# Patient Record
Sex: Female | Born: 1935
Health system: Southern US, Community
[De-identification: ages and names within clinical notes are randomized; demographics above are authoritative.]

## PROBLEM LIST (undated history)

## (undated) DIAGNOSIS — G43909 Migraine, unspecified, not intractable, without status migrainosus: Secondary | ICD-10-CM

## (undated) DIAGNOSIS — Z8669 Personal history of other diseases of the nervous system and sense organs: Secondary | ICD-10-CM

## (undated) DIAGNOSIS — M1710 Unilateral primary osteoarthritis, unspecified knee: Secondary | ICD-10-CM

## (undated) DIAGNOSIS — E079 Disorder of thyroid, unspecified: Secondary | ICD-10-CM

## (undated) DIAGNOSIS — I4891 Unspecified atrial fibrillation: Secondary | ICD-10-CM

## (undated) DIAGNOSIS — M810 Age-related osteoporosis without current pathological fracture: Secondary | ICD-10-CM

## (undated) HISTORY — DX: Migraine, unspecified, not intractable, without status migrainosus: G43.909

## (undated) HISTORY — DX: Unspecified atrial fibrillation: I48.91

## (undated) HISTORY — DX: Unilateral primary osteoarthritis, unspecified knee: M17.10

## (undated) HISTORY — PX: CATARACT EXTRACTION: SUR2

---
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
## 1989-08-19 HISTORY — PX: BUNIONECTOMY: SHX129

## 2003-08-10 ENCOUNTER — Other Ambulatory Visit: Admission: RE | Admit: 2003-08-10 | Discharge: 2003-08-10 | Payer: Self-pay | Admitting: Family Medicine

## 2005-08-30 ENCOUNTER — Other Ambulatory Visit: Admission: RE | Admit: 2005-08-30 | Discharge: 2005-08-30 | Payer: Self-pay | Admitting: Family Medicine

## 2011-02-13 ENCOUNTER — Ambulatory Visit
Admission: RE | Admit: 2011-02-13 | Discharge: 2011-02-13 | Disposition: A | Discharge status: Home / Self Care | Payer: Medicare PPO | Source: Ambulatory Visit | Attending: Cardiology | Admitting: Cardiology

## 2011-02-13 ENCOUNTER — Other Ambulatory Visit: Payer: Self-pay | Admitting: Cardiology

## 2011-02-13 DIAGNOSIS — R0789 Other chest pain: Secondary | ICD-10-CM

## 2017-10-27 ENCOUNTER — Encounter: Payer: Self-pay | Admitting: Family Medicine

## 2017-10-27 ENCOUNTER — Ambulatory Visit: Payer: Medicare Other | Admitting: Family Medicine

## 2017-10-27 ENCOUNTER — Other Ambulatory Visit: Payer: Self-pay

## 2017-10-27 VITALS — BP 132/84 | HR 90 | Temp 98.5°F | Ht 66.0 in | Wt 168.4 lb

## 2017-10-27 DIAGNOSIS — E039 Hypothyroidism, unspecified: Secondary | ICD-10-CM | POA: Diagnosis not present

## 2017-10-27 DIAGNOSIS — Z8669 Personal history of other diseases of the nervous system and sense organs: Secondary | ICD-10-CM

## 2017-10-27 DIAGNOSIS — G43009 Migraine without aura, not intractable, without status migrainosus: Secondary | ICD-10-CM

## 2017-10-27 DIAGNOSIS — M1711 Unilateral primary osteoarthritis, right knee: Secondary | ICD-10-CM

## 2017-10-27 DIAGNOSIS — F5101 Primary insomnia: Secondary | ICD-10-CM | POA: Diagnosis not present

## 2017-10-27 DIAGNOSIS — R42 Dizziness and giddiness: Secondary | ICD-10-CM

## 2017-10-27 DIAGNOSIS — M1710 Unilateral primary osteoarthritis, unspecified knee: Secondary | ICD-10-CM | POA: Insufficient documentation

## 2017-10-27 HISTORY — DX: Personal history of other diseases of the nervous system and sense organs: Z86.69

## 2017-10-27 LAB — COMPREHENSIVE METABOLIC PANEL
ALT: 14 U/L (ref 0–35)
AST: 17 U/L (ref 0–37)
Albumin: 4.4 g/dL (ref 3.5–5.2)
Alkaline Phosphatase: 66 U/L (ref 39–117)
BUN: 19 mg/dL (ref 6–23)
CHLORIDE: 102 meq/L (ref 96–112)
CO2: 31 meq/L (ref 19–32)
Calcium: 10 mg/dL (ref 8.4–10.5)
Creatinine, Ser: 1.04 mg/dL (ref 0.40–1.20)
GFR: 53.61 mL/min — ABNORMAL LOW (ref >60.00)
GLUCOSE: 96 mg/dL (ref 70–99)
POTASSIUM: 4.6 meq/L (ref 3.5–5.1)
SODIUM: 140 meq/L (ref 135–145)
Total Bilirubin: 0.6 mg/dL (ref 0.2–1.2)
Total Protein: 7.1 g/dL (ref 6.0–8.3)

## 2017-10-27 LAB — CBC WITH DIFFERENTIAL/PLATELET
BASOS ABS: 0.1 10*3/uL (ref 0.0–0.1)
Basophils Relative: 0.9 % (ref 0.0–3.0)
EOS PCT: 3.5 % (ref 0.0–5.0)
Eosinophils Absolute: 0.3 10*3/uL (ref 0.0–0.7)
HCT: 45.1 % (ref 36.0–46.0)
Hemoglobin: 14.9 g/dL (ref 12.0–15.0)
LYMPHS ABS: 2.9 10*3/uL (ref 0.7–4.0)
LYMPHS PCT: 35.4 % (ref 12.0–46.0)
MCHC: 32.9 g/dL (ref 30.0–36.0)
MCV: 91.4 fl (ref 78.0–100.0)
MONOS PCT: 7.1 % (ref 3.0–12.0)
Monocytes Absolute: 0.6 10*3/uL (ref 0.1–1.0)
NEUTROS PCT: 53.1 % (ref 43.0–77.0)
Neutro Abs: 4.3 10*3/uL (ref 1.4–7.7)
Platelets: 261 10*3/uL (ref 150.0–400.0)
RBC: 4.93 Mil/uL (ref 3.87–5.11)
RDW: 14.7 % (ref 11.5–15.5)
WBC: 8.1 10*3/uL (ref 4.0–10.5)

## 2017-10-27 LAB — LIPID PANEL
CHOL/HDL RATIO: 3
CHOLESTEROL: 191 mg/dL (ref 0–200)
HDL: 75.6 mg/dL (ref >39.00)
LDL CALC: 99 mg/dL (ref 0–99)
NONHDL: 115.42
Triglycerides: 83 mg/dL (ref 0.0–149.0)
VLDL: 16.6 mg/dL (ref 0.0–40.0)

## 2017-10-27 MED ORDER — SUVOREXANT 15 MG PO TABS: 15.0000 mg | ORAL_TABLET | Freq: Every day | ORAL | 5 refills | Status: DC

## 2017-10-27 MED ORDER — LORAZEPAM 1 MG PO TABS: ORAL_TABLET | ORAL | 0 refills | Status: DC

## 2017-10-27 NOTE — Patient Instructions (Signed)
Please return in 6 months for recheck.   Please wean off your ativan as instructed on the label, then start the belsomra 15 x 2 weeks; let me know if that is not helping and we can then try the 20mg  dose.   Use Tylenol ES 500 twice a day as needed for knee pain. You may also use Glucosamine twice a day - over the counter.   I will let you know your lab results.   Keep well hydrated.   It was a pleasure meeting you today! Thank you for choosing us to meet your healthcare needs! I truly look forward to working with you. If you have any questions or concerns, please send me a message via Mychart or call the office at [phone].

## 2017-10-27 NOTE — Progress Notes (Signed)
Subjective  CC:  Chief Complaint  Patient presents with  . Establish Care    [place]   . Knee Pain    Right sided knee pain    HPI: Cathy Lambert is a 84 y.o. female who presents to [facility] today to establish care with me as a new patient.  She has the following concerns or needs:   Very pleasant healthy 84 yo who now is living in a retirement community [place]. Her daughter teaches at [employer]. Has another daughter in [place], and a child in [place] where she spent the last 2 years. Prior to that she lived in [place]. She has a large support/friend system here.   Lives independently.   Feels great overall. Has right knee pain, worse with lowering herself when sitting down. No redness or warmth but does get some swelling. Doesn't take anything for it. Uses a knee sleeve at times. No locking or giveway. Has never had evaluation for it in past.   Lightheadedness: at times; bp has always been excellent to low; has taken bp with sxs: 90s/60s (down from 120s/70s her norm). No vertigo sxs. No orthostatic sxs. Lasts minutes to 30 minutes. No cp or palpitations. Not on heart or BP meds. Never has fallen or passed out.   Has vague light sensation over ruq abdominal skin x 2 weeks w/o rash or pain.   Hypothyroidsm: on replacement and due for recheck. Energy levels are great. No sxs of high or low thyroid.   Ativan use since 1995 for sleep. Uses intermittently now; has trouble falling asleep due to "overthinking and planning for her upcoming days"   We updated and reviewed the patient's past history in detail and it is documented below.  Patient Active Problem List   Diagnosis Date Noted  . Acquired hypothyroidism 10/27/2017  . Migraine headache without aura 10/27/2017  . Primary osteoarthritis of knee    Health Maintenance  Topic Date Due  . MAMMOGRAM  03/07/2018  . TETANUS/TDAP  06/16/2022  . INFLUENZA VACCINE  Completed  .  DEXA SCAN  Completed  . PNA vac Low Risk Adult  Completed   Immunization History  Administered Date(s) Administered  . Influenza, High Dose Seasonal PF 06/07/2017  . Zoster 11/06/2007   Current Meds  Medication Sig  . cholecalciferol (VITAMIN D) 1000 units tablet Take 2,000 Units by mouth daily.  [name] Kitchen. levothyroxine (SYNTHROID, LEVOTHROID) 75 MCG tablet Take 75 mcg by mouth daily before breakfast.  . LORazepam (ATIVAN) 1 MG tablet Take 1 tablet (1 mg total) by mouth 3 (three) times a week for 7 days, THEN 0.5 tablets (0.5 mg total) 3 (three) times a week for 7 days.  . magnesium gluconate (MAGONATE) 500 MG tablet Take 500 mg by mouth 2 (two) times daily.  . Multiple Vitamin (MULTIVITAMIN) tablet Take 1 tablet by mouth daily.  . [DISCONTINUED] LORazepam (ATIVAN) 2 MG tablet Take 2 mg by mouth every 6 (six) hours as needed for anxiety.    Allergies: Patient has No Known Allergies. Past Medical History Patient  has a past medical history of Migraines and Primary osteoarthritis of knee. Past Surgical History Patient  has a past surgical history that includes Bunionectomy (1991); Cataract extraction (2008-2009); and Tonsillectomy and adenoidectomy (1953). Family History: Patient family history includes Arthritis in her mother; Cancer in her maternal grandmother, mother, paternal grandfather, sister, and son; Diabetes in her brother; Hearing loss in her sister; Stroke  in her maternal grandfather. Social History:  Patient  reports that  has never smoked. she has never used smokeless tobacco. She reports that she does not drink alcohol or use drugs.  Review of Systems: Constitutional: negative for fever or malaise Ophthalmic: negative for photophobia, double vision or loss of vision Cardiovascular: negative for chest pain, dyspnea on exertion, or new LE swelling Respiratory: negative for SOB or persistent cough Gastrointestinal: negative for abdominal pain, change in bowel habits or  melena Genitourinary: negative for dysuria or gross hematuria Musculoskeletal: negative for new gait disturbance or muscular weakness Integumentary: negative for new or persistent rashes Neurological: negative for TIA or stroke symptoms Psychiatric: negative for SI or delusions Allergic/Immunologic: negative for hives  Patient Care Team    Relationship Specialty Notifications Start End  Willow Ora, MD PCP - General Family Medicine  10/27/17     Objective  Vitals: BP 132/84   Pulse 90   Temp 98.5 F (36.9 C)   Ht 5\' 6"  (1.676 m)   Wt 168 lb 6.4 oz (76.4 kg)   BMI 27.18 kg/m  General:  Well developed, well nourished, no acute distress, looks younger than stated age  Psych:  Alert and oriented,normal mood and affect HEENT:  Normocephalic, atraumatic, non-icteric sclera, PERRL, oropharynx is without mass or exudate, supple neck without adenopathy, mass or thyromegaly Cardiovascular:  RRR without gallop, rub or murmur, nondisplaced PMI Respiratory:  Good breath sounds bilaterally, CTAB with normal respiratory effort Gastrointestinal: normal bowel sounds, soft, non-tender, no noted masses. No HSM MSK: no deformities, contusions. Joints are without erythema or swelling Skin:  Warm, no rashes or suspicious lesions noted Neurologic:    Mental status is normal. Gross motor and sensory exams are normal. Normal gait  Assessment  1. Acquired hypothyroidism   2. Migraine without aura and without status migrainosus, not intractable   3. Primary insomnia   4. Episodic lightheadedness   5. Primary osteoarthritis of right knee      Plan   Recheck thyroid on meds. Clinically euthyroid  Rare and remote migraine history.   Insomnia: wean ativan and trial of belsomra. Extensive education given.   Lightheadedness: keep hydrated. No red flag sxs. Monitor and check blood work.   OA - discussed supportive care; further eval if worsens.   Follow up:  Return in about 3 months (around  01/27/2018) for recheck sleep. needs AWV as well.   Commons side effects, risks, benefits, and alternatives for medications and treatment plan prescribed today were discussed, and the patient expressed understanding of the given instructions. Patient is instructed to call or message via MyChart if he/she has any questions or concerns regarding our treatment plan. No barriers to understanding were identified. We discussed Red Flag symptoms and signs in detail. Patient expressed understanding regarding what to do in case of urgent or emergency type symptoms.   Medication list was reconciled, printed and provided to the patient in AVS. Patient instructions and summary information was reviewed with the patient as documented in the AVS. This note was prepared with assistance of Dragon voice recognition software. Occasional wrong-word or sound-a-like substitutions may have occurred due to the inherent limitations of voice recognition software  Orders Placed This Encounter  Procedures  . CBC with Differential/Platelet  . Comprehensive metabolic panel  . Lipid panel  . TSH   Meds ordered this encounter  Medications  . LORazepam (ATIVAN) 1 MG tablet    Sig: Take 1 tablet (1 mg total) by mouth 3 (three)  times a week for 7 days, THEN 0.5 tablets (0.5 mg total) 3 (three) times a week for 7 days.    Dispense:  10 tablet    Refill:  0  . Suvorexant (BELSOMRA) 15 MG TABS    Sig: Take 15 mg by mouth at bedtime.    Dispense:  30 tablet    Refill:  5

## 2017-10-28 ENCOUNTER — Other Ambulatory Visit: Payer: Self-pay | Admitting: Family Medicine

## 2017-10-28 LAB — TSH: TSH: 0.18 u[IU]/mL — AB (ref 0.35–4.50)

## 2017-10-28 MED ORDER — LEVOTHYROXINE SODIUM 50 MCG PO TABS: 50.0000 ug | ORAL_TABLET | Freq: Every day | ORAL | 1 refills | Status: DC

## 2017-10-28 NOTE — Progress Notes (Signed)
Lab Results  Component Value Date   TSH 0.18 (L) 10/27/2017   Decrease synthroid to 50 from 75; recheck 8 weeks.

## 2017-10-30 ENCOUNTER — Telehealth: Payer: Self-pay | Admitting: Emergency Medicine

## 2017-10-30 ENCOUNTER — Other Ambulatory Visit: Payer: Self-pay

## 2017-10-30 MED ORDER — LORAZEPAM 2 MG PO TABS: 1.0000 mg | ORAL_TABLET | Freq: Every evening | ORAL | 3 refills | Status: DC | PRN

## 2017-10-30 NOTE — Telephone Encounter (Signed)
It should be free with the free coupon?

## 2017-10-30 NOTE — Telephone Encounter (Signed)
Please send Lorazepam 2mg , 30 tabs, 1/2 tablet daily for the patient to [facility] [address]. Thank you!

## 2017-10-30 NOTE — Telephone Encounter (Signed)
Called the patient and she stated that since she is on Medicare she will not be able to use the coupon. She stated that the prescription will be $365. Spoke with Dr. Mardelle MatteAndy and she stated that she was taking the Lorazepam and this is okay for her to continue to take. Patient stated that she wants the Lorazepam 2mg  and to take half daily, 30 pills and she uses for 90 days. [facility].

## 2017-10-30 NOTE — Telephone Encounter (Signed)
Prior Authroization sent via Cover my Meds. Awaiting Approval

## 2017-10-30 NOTE — Telephone Encounter (Signed)
Patient is requesting another Prescription, Belsomra is too expensive. Please advise.

## 2017-10-31 NOTE — Telephone Encounter (Signed)
Please start appeal. She has been on this medication for > 20 years.

## 2017-10-31 NOTE — Telephone Encounter (Signed)
Medicare denied the prescription for Lorazepam. Please advise on another medication.    Kathi SimpersAmy Peterman,  LPN

## 2017-10-31 NOTE — Telephone Encounter (Signed)
[facility]- [phone]

## 2017-11-04 ENCOUNTER — Telehealth: Payer: Self-pay | Admitting: Emergency Medicine

## 2017-11-04 NOTE — Telephone Encounter (Signed)
Spoke with [employer] regarding appeal. They are working on appeal .

## 2017-11-04 NOTE — Telephone Encounter (Signed)
Copied from CRM [phone]. Topic: Inquiry >> Nov 03, 2017  6:09 PM Alexander BergeronBarksdale, Harvey B wrote: Reason for CRM: [employer] appeals and grievance dept called and asked to be called back to discuss a matter concerning the pt, contact [phone]  >> Nov 04, 2017  9:41 AM Maia Pettiesrtiz, Kristie S wrote: Caller Name: Cordelia PenSherry at [facility] [place] Phone: [phone]  Calling about appeal for lorazepam. Requesting to speak to Naeemah Jasmer or Dr. Mardelle MatteAndy. Please call back when available.

## 2017-11-06 NOTE — Telephone Encounter (Signed)
Insurance approved, Quarry managerauth # [id number]

## 2017-11-06 NOTE — Telephone Encounter (Signed)
Dr Mardelle MatteAndy,   Lorain ChildesFYI, Patient's Lorazepam was approved.    Kathi SimpersAmy Eutha Cude,  LPN

## 2017-12-22 ENCOUNTER — Other Ambulatory Visit: Payer: Self-pay

## 2017-12-22 ENCOUNTER — Ambulatory Visit: Payer: Medicare Other | Admitting: Family Medicine

## 2017-12-22 ENCOUNTER — Encounter: Payer: Self-pay | Admitting: Family Medicine

## 2017-12-22 VITALS — BP 138/86 | HR 81 | Temp 98.1°F | Ht 66.0 in | Wt 170.2 lb

## 2017-12-22 DIAGNOSIS — E039 Hypothyroidism, unspecified: Secondary | ICD-10-CM | POA: Diagnosis not present

## 2017-12-22 DIAGNOSIS — M1711 Unilateral primary osteoarthritis, right knee: Secondary | ICD-10-CM

## 2017-12-22 LAB — TSH: TSH: 7.47 u[IU]/mL — ABNORMAL HIGH (ref 0.35–4.50)

## 2017-12-22 MED ORDER — DICLOFENAC SODIUM 1 % TD GEL: 4.0000 g | Freq: Four times a day (QID) | TRANSDERMAL | 3 refills | Status: DC | PRN

## 2017-12-22 NOTE — Patient Instructions (Addendum)
Please schedule a follow up appointment with me in 6 months for your Thyroid.   Medicare recommends an Annual Wellness Visit for all patients. Please schedule this to be done with our Nurse Educator, Maudie Mercury. This is an informative "talk" visit; it's goals are to ensure that your health care needs are being met and to give you education regarding avoiding falls, ensuring you are not suffering from depression or problems with memory or thinking, and to educate you on Advance Care Planning. It helps me take good care of you!    Osteoarthritis Osteoarthritis is a type of arthritis that affects tissue that covers the ends of bones in joints (cartilage). Cartilage acts as a cushion between the bones and helps them move smoothly. Osteoarthritis results when cartilage in the joints gets worn down. Osteoarthritis is sometimes called "wear and tear" arthritis. Osteoarthritis is the most common form of arthritis. It often occurs in older people. It is a condition that gets worse over time (a progressive condition). Joints that are most often affected by this condition are in:  Fingers.  Toes.  Hips.  Knees.  Spine, including neck and lower back.  What are the causes? This condition is caused by age-related wearing down of cartilage that covers the ends of bones. What increases the risk? The following factors may make you more likely to develop this condition:  Older age.  Being overweight or obese.  Overuse of joints, such as in athletes.  Past injury of a joint.  Past surgery on a joint.  Family history of osteoarthritis.  What are the signs or symptoms? The main symptoms of this condition are pain, swelling, and stiffness in the joint. The joint may lose its shape over time. Small pieces of bone or cartilage may break off and float inside of the joint, which may cause more pain and damage to the joint. Small deposits of bone (osteophytes) may grow on the edges of the joint. Other symptoms  may include:  A grating or scraping feeling inside the joint when you move it.  Popping or creaking sounds when you move.  Symptoms may affect one or more joints. Osteoarthritis in a major joint, such as your knee or hip, can make it painful to walk or exercise. If you have osteoarthritis in your hands, you might not be able to grip items, twist your hand, or control small movements of your hands and fingers (fine motor skills). How is this diagnosed? This condition may be diagnosed based on:  Your medical history.  A physical exam.  Your symptoms.  X-rays of the affected joint(s).  Blood tests to rule out other types of arthritis.  How is this treated? There is no cure for this condition, but treatment can help to control pain and improve joint function. Treatment plans may include:  A prescribed exercise program that allows for rest and joint relief. You may work with a physical therapist.  A weight control plan.  Pain relief techniques, such as: ? Applying heat and cold to the joint. ? Electric pulses delivered to nerve endings under the skin (transcutaneous electrical nerve stimulation, or TENS). ? Massage. ? Certain nutritional supplements.  NSAIDs or prescription medicines to help relieve pain.  Medicine to help relieve pain and inflammation (corticosteroids). This can be given by mouth (orally) or as an injection.  Assistive devices, such as a brace, wrap, splint, specialized glove, or cane.  Surgery, such as: ? An osteotomy. This is done to reposition the bones and relieve  pain or to remove loose pieces of bone and cartilage. ? Joint replacement surgery. You may need this surgery if you have very bad (advanced) osteoarthritis.  Follow these instructions at home: Activity  Rest your affected joints as directed by your health care provider.  Do not drive or use heavy machinery while taking prescription pain medicine.  Exercise as directed. Your health care  provider or physical therapist may recommend specific types of exercise, such as: ? Strengthening exercises. These are done to strengthen the muscles that support joints that are affected by arthritis. They can be performed with weights or with exercise bands to add resistance. ? Aerobic activities. These are exercises, such as brisk walking or water aerobics, that get your heart pumping. ? Range-of-motion activities. These keep your joints easy to move. ? Balance and agility exercises. Managing pain, stiffness, and swelling  If directed, apply heat to the affected area as often as told by your health care provider. Use the heat source that your health care provider recommends, such as a moist heat pack or a heating pad. ? If you have a removable assistive device, remove it as told by your health care provider. ? Place a towel between your skin and the heat source. If your health care provider tells you to keep the assistive device on while you apply heat, place a towel between the assistive device and the heat source. ? Leave the heat on for 20-30 minutes. ? Remove the heat if your skin turns bright red. This is especially important if you are unable to feel pain, heat, or cold. You may have a greater risk of getting burned.  If directed, put ice on the affected joint: ? If you have a removable assistive device, remove it as told by your health care provider. ? Put ice in a plastic bag. ? Place a towel between your skin and the bag. If your health care provider tells you to keep the assistive device on during icing, place a towel between the assistive device and the bag. ? Leave the ice on for 20 minutes, 2-3 times a day. General instructions  Take over-the-counter and prescription medicines only as told by your health care provider.  Maintain a healthy weight. Follow instructions from your health care provider for weight control. These may include dietary restrictions.  Do not use any  products that contain nicotine or tobacco, such as cigarettes and e-cigarettes. These can delay bone healing. If you need help quitting, ask your health care provider.  Use assistive devices as directed by your health care provider.  Keep all follow-up visits as told by your health care provider. This is important. Where to find more information:  [employer] of Arthritis and Musculoskeletal and Skin Diseases: www.niams.SouthExposed.es  [employer] on Aging: http://kim-miller.com/  American College of Rheumatology: www.rheumatology.org Contact a health care provider if:  Your skin turns red.  You develop a rash.  You have pain that gets worse.  You have a fever along with joint or muscle aches. Get help right away if:  You lose a lot of weight.  You suddenly lose your appetite.  You have night sweats. Summary  Osteoarthritis is a type of arthritis that affects tissue covering the ends of bones in joints (cartilage).  This condition is caused by age-related wearing down of cartilage that covers the ends of bones.  The main symptom of this condition is pain, swelling, and stiffness in the joint.  There is no cure for this condition,  but treatment can help to control pain and improve joint function. This information is not intended to replace advice given to you by your health care provider. Make sure you discuss any questions you have with your health care provider. Document Released: 08/05/2005 Document Revised: 04/08/2016 Document Reviewed: 04/08/2016 Elsevier Interactive Patient Education  [employer].

## 2017-12-22 NOTE — Progress Notes (Signed)
Subjective  CC:  Chief Complaint  Patient presents with  . Hypothyroidism    Follow-Up on TSH- Recheck from 8 weeks ago     HPI: Cathy Lambert is a 84 y.o. female who presents to the office today to address the problems listed above in the chief complaint.  Last tsh was low. Decreased dose from 75 to 50 daily. Pt still feeling great. No AEs or concerns. No edema or sluggishness. No sob. Compliance is excellent.  C/o persistent right knee pain. Has OA suspected. hasnt' been using any meds. Pains with getting up from seated position. No swelling, locking or give way. No injury.   I reviewed the patients updated PMH, FH, and SocHx.    Patient Active Problem List   Diagnosis Date Noted  . Acquired hypothyroidism 10/27/2017  . Migraine headache without aura 10/27/2017  . Primary osteoarthritis of knee    Current Meds  Medication Sig  . cholecalciferol (VITAMIN D) 1000 units tablet Take 2,000 Units by mouth daily.  [name] Kitchen levothyroxine (SYNTHROID, LEVOTHROID) 50 MCG tablet Take 1 tablet (50 mcg total) by mouth daily before breakfast.  . LORazepam (ATIVAN) 2 MG tablet Take 0.5 tablets (1 mg total) by mouth at bedtime as needed for sleep.  . magnesium gluconate (MAGONATE) 500 MG tablet Take 500 mg by mouth 2 (two) times daily.  . Multiple Vitamin (MULTIVITAMIN) tablet Take 1 tablet by mouth daily.    Allergies: Patient has No Known Allergies. Family History: Patient family history includes Arthritis in her mother; Cancer in her maternal grandmother, mother, paternal grandfather, sister, and son; Diabetes in her brother; Hearing loss in her sister; Stroke in her maternal grandfather. Social History:  Patient  reports that she has never smoked. She has never used smokeless tobacco. She reports that she does not drink alcohol or use drugs.  Review of Systems: Constitutional: Negative for fever malaise or anorexia Cardiovascular: negative for chest pain Respiratory: negative for SOB or  persistent cough Gastrointestinal: negative for abdominal pain  Objective  Vitals: BP 138/86   Pulse 81   Temp 98.1 F (36.7 C)   Ht  (1.676 m)   Wt 170 lb 3.2 oz (77.2 kg)   BMI 27.47 kg/m  General: no acute distress , A&Ox3 HEENT: PEERL, conjunctiva normal, Oropharynx moist,neck is supple, no goiter or thyroid nodules Cardiovascular:  RRR without murmur or gallop.  Respiratory:  Good breath sounds bilaterally, CTAB with normal respiratory effort Ext: no edema Neuro: no tremor Skin:  Warm, no rashes  Assessment  1. Acquired hypothyroidism   2. Primary osteoarthritis of right knee      Plan   Recheck TSH:  Adjust meds as needed. Gave instructions: Recommendations for proper thyroid replacement: - in am - fasting - at least 30 min from b'fast - no PPIs, Ca, Fe, MVI within 4 hours   OA knee: start tylenol daily and/or voltaren gel daily. F/u if not improving.   Follow up: Return in about 6 months (around 06/24/2018) for recheck, AWV at patient's convenience.    Commons side effects, risks, benefits, and alternatives for medications and treatment plan prescribed today were discussed, and the patient expressed understanding of the given instructions. Patient is instructed to call or message via MyChart if he/she has any questions or concerns regarding our treatment plan. No barriers to understanding were identified. We discussed Red Flag symptoms and signs in detail. Patient expressed understanding regarding what to do in case of urgent or emergency type symptoms.  Medication list was reconciled, printed and provided to the patient in AVS. Patient instructions and summary information was reviewed with the patient as documented in the AVS. This note was prepared with assistance of Dragon voice recognition software. Occasional wrong-word or sound-a-like substitutions may have occurred due to the inherent limitations of voice recognition software  Orders Placed This  Encounter  Procedures  . TSH   Meds ordered this encounter  Medications  . diclofenac sodium (VOLTAREN) 1 % GEL    Sig: Apply 4 g topically 4 (four) times daily as needed.    Dispense:  400 g    Refill:  3

## 2017-12-23 ENCOUNTER — Telehealth: Payer: Self-pay | Admitting: Family Medicine

## 2017-12-23 NOTE — Telephone Encounter (Signed)
Copied from CRM [id number]. Topic: Quick Communication - See Telephone Encounter >> Dec 23, 2017  1:06 PM Waymon Amato wrote: Insurance co is calling to let office know that the medication diclofenac has been authorized and approved on 12/22/17 and good for a year of  12/23/2018 reference cover my meds

## 2017-12-24 ENCOUNTER — Other Ambulatory Visit: Payer: Self-pay | Admitting: Emergency Medicine

## 2017-12-24 DIAGNOSIS — E039 Hypothyroidism, unspecified: Secondary | ICD-10-CM

## 2017-12-24 MED ORDER — LEVOTHYROXINE SODIUM 50 MCG PO TABS: ORAL_TABLET | ORAL | 1 refills | Status: DC

## 2017-12-24 NOTE — Telephone Encounter (Signed)
Noted  

## 2017-12-24 NOTE — Telephone Encounter (Signed)
See attached note from insurance co. Regarding the diclofenac

## 2017-12-24 NOTE — Addendum Note (Signed)
Addended by: Asencion Partridge on: 12/24/2017 08:20 AM   Modules accepted: Orders

## 2018-01-26 ENCOUNTER — Ambulatory Visit: Payer: Medicare Other | Admitting: Family Medicine

## 2018-02-26 ENCOUNTER — Other Ambulatory Visit (INDEPENDENT_AMBULATORY_CARE_PROVIDER_SITE_OTHER): Payer: Medicare Other

## 2018-02-26 DIAGNOSIS — E039 Hypothyroidism, unspecified: Secondary | ICD-10-CM | POA: Diagnosis not present

## 2018-02-26 LAB — TSH: TSH: 4.43 u[IU]/mL (ref 0.35–4.50)

## 2018-03-31 NOTE — Progress Notes (Signed)
Subjective:   Cathy Lambert is a 84 y.o. female who presents for Medicare Annual (Subsequent) preventive examination.  Review of Systems:  No ROS.  Medicare Wellness Visit. Additional risk factors are reflected in the social history.  Cardiac Risk Factors include: advanced age (>5455men, 31>65 women) Sleep patterns: Sleeps well Home Safety/Smoke Alarms: Feels safe in home. Smoke alarms in place.  Living environment; residence and Firearm Safety: [place]  Seat Belt Safety/Bike Helmet: Wears seat belt.   Female:   Pap-N/A       Mammo-03/07/2017, pt reports normal. Ordered today.      Dexa scan-03/07/2017, pt reports Osteopenia.      CCS-Pt reports colonoscopy 2009, reports normal.      Objective:     Vitals: BP (!) 150/80 (BP Location: Left Arm, Patient Position: Sitting, Cuff Size: Normal)   Pulse 76   Ht 5\' 6"  (1.676 m)   Wt 173 lb 6 oz (78.6 kg)   SpO2 96%   BMI 27.98 kg/m   Body mass index is 27.98 kg/m.  Advanced Directives 04/01/2018  Does Patient Have a Medical Advance Directive? Yes  Type of Advance Directive Healthcare Power of Attorney  Does patient want to make changes to medical advance directive? Yes (MAU/Ambulatory/Procedural Areas - Information given)  Copy of Healthcare Power of Attorney in Chart? No - copy requested    Tobacco Social History   Tobacco Use  Smoking Status Never Smoker  Smokeless Tobacco Never Used     Counseling given: Not Answered    Past Medical History:  Diagnosis Date  . Migraines   . Primary osteoarthritis of knee    Past Surgical History:  Procedure Laterality Date  . BUNIONECTOMY  1991  . CATARACT EXTRACTION  2008-2009  . TONSILLECTOMY AND ADENOIDECTOMY  1953   Family History  Problem Relation Age of Onset  . Arthritis Mother   . Cancer Mother   . Cancer Sister   . Diabetes Brother   . Cancer Maternal Grandmother   . Stroke Maternal Grandfather   . Cancer Paternal Grandfather   .  Hearing loss Sister   . Cancer Son    Social History   Socioeconomic History  . Marital status: Divorced    Spouse name: Not on file  . Number of children: Not on file  . Years of education: Not on file  . Highest education level: Not on file  Occupational History  . Not on file  Social Needs  . Financial resource strain: Not on file  . Food insecurity:    Worry: Not on file    Inability: Not on file  . Transportation needs:    Medical: Not on file    Non-medical: Not on file  Tobacco Use  . Smoking status: Never Smoker  . Smokeless tobacco: Never Used  Substance and Sexual Activity  . Alcohol use: No    Frequency: Never  . Drug use: No  . Sexual activity: Never    Birth control/protection: Post-menopausal  Lifestyle  . Physical activity:    Days per week: Not on file    Minutes per session: Not on file  . Stress: Not on file  Relationships  . Social connections:    Talks on phone: Not on file    Gets together: Not on file    Attends religious service: Not on file    Active member of club or organization: Not on file    Attends meetings of clubs or  organizations: Not on file    Relationship status: Not on file  Other Topics Concern  . Not on file  Social History Narrative  . Not on file    Outpatient Encounter Medications as of 04/01/2018  Medication Sig  . cholecalciferol (VITAMIN D) 1000 units tablet Take 2,000 Units by mouth daily.  [name] Kitchen levothyroxine (SYNTHROID, LEVOTHROID) 50 MCG tablet Take (2 tabs) every T and Th, take (1 tab) all other days  . LORazepam (ATIVAN) 2 MG tablet Take 0.5 tablets (1 mg total) by mouth at bedtime as needed for sleep.  . magnesium gluconate (MAGONATE) 500 MG tablet Take 500 mg by mouth 2 (two) times daily.  . Multiple Vitamin (MULTIVITAMIN) tablet Take 1 tablet by mouth daily.  [name] Kitchen PREBIOTIC PRODUCT PO Take by mouth.  . Probiotic Product (PROBIOTIC DAILY PO) Take by mouth.  . diclofenac sodium (VOLTAREN) 1 % GEL Apply  4 g topically 4 (four) times daily as needed. (Patient not taking: Reported on 04/01/2018)  . Zoster Vaccine Adjuvanted [facility]) injection Inject 0.5 mLs into the muscle once for 1 dose.   No facility-administered encounter medications on file as of 04/01/2018.     Activities of Daily Living In your present state of health, do you have any difficulty performing the following activities: 04/01/2018 10/27/2017  Hearing? N N  Vision? N N  Difficulty concentrating or making decisions? N N  Walking or climbing stairs? Y N  Comment right knee -  Dressing or bathing? N N  Doing errands, shopping? N N  Preparing Food and eating ? N -  Using the Toilet? N -  In the past six months, have you accidently leaked urine? N -  Do you have problems with loss of bowel control? N -  Managing your Medications? N -  Managing your Finances? N -  Housekeeping or managing your Housekeeping? N -  Some recent data might be hidden    Patient Care Team: Willow Ora, MD as PCP - General (Family Medicine)    Assessment:   This is a routine wellness examination for Cathy Lambert.  Exercise Activities and Dietary recommendations Current Exercise Habits: Home exercise routine, Type of exercise: Other - see comments(water exercises), Time (Minutes): > 60, Frequency (Times/Week): 2, Weekly Exercise (Minutes/Week): 0, Exercise limited by: None identified   Diet (meal preparation, eat out, water intake, caffeinated beverages, dairy products, fruits and vegetables): Drinks water and coffee.  Breakfast: Fasting 2 days/week; eggs/toast; coffee; cereal w/fruit Lunch/Dinner: Eats heart healthy diet.   Goals    . Exercise 150 min/wk Moderate Activity     Continue being active at pool and [facility]. Lose about 7 pounds.        Fall Risk Fall Risk  04/01/2018 10/27/2017  Falls in the past year? No No    Depression Screen PHQ 2/9 Scores 04/01/2018 10/27/2017 10/27/2017  PHQ - 2 Score 0 0 0  PHQ- 9 Score - - 0      Cognitive Function MMSE - Mini Mental State Exam 04/01/2018  Orientation to time 5  Orientation to Place 5  Registration 3  Attention/ Calculation 5  Recall 3  Language- name 2 objects 2  Language- repeat 1  Language- follow 3 step command 3  Language- read & follow direction 1  Write a sentence 1  Copy design 1  Total score 30        Immunization History  Administered Date(s) Administered  . Influenza, High Dose Seasonal PF 06/07/2017  .  Zoster 11/06/2007    Screening Tests Health Maintenance  Topic Date Due  . MAMMOGRAM  03/07/2018  . INFLUENZA VACCINE  03/19/2018  . TETANUS/TDAP  06/16/2022  . DEXA SCAN  Completed  . PNA vac Low Risk Adult  Completed        Plan:     Shingles vaccine at pharmacy.   Schedule eye exam.   Schedule mammogram.   Bring a copy of your living will and/or healthcare power of attorney to your next office visit.  Continue doing brain stimulating activities (puzzles, reading, adult coloring books, staying active) to keep memory sharp.    I have personally reviewed and noted the following in the patient's chart:   . Medical and social history . Use of alcohol, tobacco or illicit drugs  . Current medications and supplements . Functional ability and status . Nutritional status . Physical activity . Advanced directives . List of other physicians . Hospitalizations, surgeries, and ER visits in previous 12 months . Vitals . Screenings to include cognitive, depression, and falls . Referrals and appointments  In addition, I have reviewed and discussed with patient certain preventive protocols, quality metrics, and best practice recommendations. A written personalized care plan for preventive services as well as general preventive health recommendations were provided to patient.     Cathy PennaKimberly R Kynedi Profitt, RN  04/01/2018  PCP Notes: -BP elevated (150/80), pt reports normal at home. Advised to continue to monitor.  -Pt c/o bilateral  foot neuropathy x 6-7 years. Would like to discuss at next OV.  -Pt c/o right knee pain, prescribed Voltaren gel but does not work. Would like to discuss at next OV.  -Next f/u with PCP 06/2018

## 2018-04-01 ENCOUNTER — Ambulatory Visit (INDEPENDENT_AMBULATORY_CARE_PROVIDER_SITE_OTHER): Payer: Medicare Other

## 2018-04-01 ENCOUNTER — Other Ambulatory Visit: Payer: Self-pay

## 2018-04-01 VITALS — BP 150/80 | HR 76 | Ht 66.0 in | Wt 173.4 lb

## 2018-04-01 DIAGNOSIS — Z0000 Encounter for general adult medical examination without abnormal findings: Secondary | ICD-10-CM

## 2018-04-01 DIAGNOSIS — Z23 Encounter for immunization: Secondary | ICD-10-CM

## 2018-04-01 DIAGNOSIS — Z1239 Encounter for other screening for malignant neoplasm of breast: Secondary | ICD-10-CM

## 2018-04-01 MED ORDER — ZOSTER VAC RECOMB ADJUVANTED 50 MCG/0.5ML IM SUSR: 0.5000 mL | Freq: Once | INTRAMUSCULAR | 1 refills | Status: AC

## 2018-04-01 NOTE — Patient Instructions (Addendum)
Shingles vaccine at pharmacy.   Schedule eye exam.   Schedule mammogram.   Bring a copy of your living will and/or healthcare power of attorney to your next office visit.  Continue doing brain stimulating activities (puzzles, reading, adult coloring books, staying active) to keep memory sharp.    Health Maintenance, Female Adopting a healthy lifestyle and getting preventive care can go a long way to promote health and wellness. Talk with your health care provider about what schedule of regular examinations is right for you. This is a good chance for you to check in with your provider about disease prevention and staying healthy. In between checkups, there are plenty of things you can do on your own. Experts have done a lot of research about which lifestyle changes and preventive measures are most likely to keep you healthy. Ask your health care provider for more information. Weight and diet Eat a healthy diet  Be sure to include plenty of vegetables, fruits, low-fat dairy products, and lean protein.  Do not eat a lot of foods high in solid fats, added sugars, or salt.  Get regular exercise. This is one of the most important things you can do for your health. ? Most adults should exercise for at least 150 minutes each week. The exercise should increase your heart rate and make you sweat (moderate-intensity exercise). ? Most adults should also do strengthening exercises at least twice a week. This is in addition to the moderate-intensity exercise.  Maintain a healthy weight  Body mass index (BMI) is a measurement that can be used to identify possible weight problems. It estimates body fat based on height and weight. Your health care provider can help determine your BMI and help you achieve or maintain a healthy weight.  For females 82 years of age and older: ? A BMI below 18.5 is considered underweight. ? A BMI of 18.5 to 24.9 is normal. ? A BMI of 25 to 29.9 is considered  overweight. ? A BMI of 30 and above is considered obese.  Watch levels of cholesterol and blood lipids  You should start having your blood tested for lipids and cholesterol at 82 years of age, then have this test every 5 years.  You may need to have your cholesterol levels checked more often if: ? Your lipid or cholesterol levels are high. ? You are older than 82 years of age. ? You are at high risk for heart disease.  Cancer screening Lung Cancer  Lung cancer screening is recommended for adults 42-77 years old who are at high risk for lung cancer because of a history of smoking.  A yearly low-dose CT scan of the lungs is recommended for people who: ? Currently smoke. ? Have quit within the past 15 years. ? Have at least a 30-pack-year history of smoking. A pack year is smoking an average of one pack of cigarettes a day for 1 year.  Yearly screening should continue until it has been 15 years since you quit.  Yearly screening should stop if you develop a health problem that would prevent you from having lung cancer treatment.  Breast Cancer  Practice breast self-awareness. This means understanding how your breasts normally appear and feel.  It also means doing regular breast self-exams. Let your health care provider know about any changes, no matter how small.  If you are in your 20s or 30s, you should have a clinical breast exam (CBE) by a health care provider every 1-3 years as part  of a regular health exam.  If you are 30 or older, have a CBE every year. Also consider having a breast X-ray (mammogram) every year.  If you have a family history of breast cancer, talk to your health care provider about genetic screening.  If you are at high risk for breast cancer, talk to your health care provider about having an MRI and a mammogram every year.  Breast cancer gene (BRCA) assessment is recommended for women who have family members with BRCA-related cancers. BRCA-related cancers  include: ? Breast. ? Ovarian. ? Tubal. ? Peritoneal cancers.  Results of the assessment will determine the need for genetic counseling and BRCA1 and BRCA2 testing.  Cervical Cancer Your health care provider may recommend that you be screened regularly for cancer of the pelvic organs (ovaries, uterus, and vagina). This screening involves a pelvic examination, including checking for microscopic changes to the surface of your cervix (Pap test). You may be encouraged to have this screening done every 3 years, beginning at age 70.  For women ages 17-65, health care providers may recommend pelvic exams and Pap testing every 3 years, or they may recommend the Pap and pelvic exam, combined with testing for human papilloma virus (HPV), every 5 years. Some types of HPV increase your risk of cervical cancer. Testing for HPV may also be done on women of any age with unclear Pap test results.  Other health care providers may not recommend any screening for nonpregnant women who are considered low risk for pelvic cancer and who do not have symptoms. Ask your health care provider if a screening pelvic exam is right for you.  If you have had past treatment for cervical cancer or a condition that could lead to cancer, you need Pap tests and screening for cancer for at least 20 years after your treatment. If Pap tests have been discontinued, your risk factors (such as having a new sexual partner) need to be reassessed to determine if screening should resume. Some women have medical problems that increase the chance of getting cervical cancer. In these cases, your health care provider may recommend more frequent screening and Pap tests.  Colorectal Cancer  This type of cancer can be detected and often prevented.  Routine colorectal cancer screening usually begins at 82 years of age and continues through 82 years of age.  Your health care provider may recommend screening at an earlier age if you have risk factors  for colon cancer.  Your health care provider may also recommend using home test kits to check for hidden blood in the stool.  A small camera at the end of a tube can be used to examine your colon directly (sigmoidoscopy or colonoscopy). This is done to check for the earliest forms of colorectal cancer.  Routine screening usually begins at age 85.  Direct examination of the colon should be repeated every 5-10 years through 82 years of age. However, you may need to be screened more often if early forms of precancerous polyps or small growths are found.  Skin Cancer  Check your skin from head to toe regularly.  Tell your health care provider about any new moles or changes in moles, especially if there is a change in a mole's shape or color.  Also tell your health care provider if you have a mole that is larger than the size of a pencil eraser.  Always use sunscreen. Apply sunscreen liberally and repeatedly throughout the day.  Protect yourself by wearing long  sleeves, pants, a wide-brimmed hat, and sunglasses whenever you are outside.  Heart disease, diabetes, and high blood pressure  High blood pressure causes heart disease and increases the risk of stroke. High blood pressure is more likely to develop in: ? People who have blood pressure in the high end of the normal range (130-139/85-89 mm Hg). ? People who are overweight or obese. ? People who are African American.  If you are 93-79 years of age, have your blood pressure checked every 3-5 years. If you are 85 years of age or older, have your blood pressure checked every year. You should have your blood pressure measured twice-once when you are at a hospital or clinic, and once when you are not at a hospital or clinic. Record the average of the two measurements. To check your blood pressure when you are not at a hospital or clinic, you can use: ? An automated blood pressure machine at a pharmacy. ? A home blood pressure monitor.  If  you are between 74 years and 26 years old, ask your health care provider if you should take aspirin to prevent strokes.  Have regular diabetes screenings. This involves taking a blood sample to check your fasting blood sugar level. ? If you are at a normal weight and have a low risk for diabetes, have this test once every three years after 82 years of age. ? If you are overweight and have a high risk for diabetes, consider being tested at a younger age or more often. Preventing infection Hepatitis B  If you have a higher risk for hepatitis B, you should be screened for this virus. You are considered at high risk for hepatitis B if: ? You were born in a country where hepatitis B is common. Ask your health care provider which countries are considered high risk. ? Your parents were born in a high-risk country, and you have not been immunized against hepatitis B (hepatitis B vaccine). ? You have HIV or AIDS. ? You use needles to inject street drugs. ? You live with someone who has hepatitis B. ? You have had sex with someone who has hepatitis B. ? You get hemodialysis treatment. ? You take certain medicines for conditions, including cancer, organ transplantation, and autoimmune conditions.  Hepatitis C  Blood testing is recommended for: ? Everyone born from [birth year] through 1963. ? Anyone with known risk factors for hepatitis C.  Sexually transmitted infections (STIs)  You should be screened for sexually transmitted infections (STIs) including gonorrhea and chlamydia if: ? You are sexually active and are younger than 82 years of age. ? You are older than 82 years of age and your health care provider tells you that you are at risk for this type of infection. ? Your sexual activity has changed since you were last screened and you are at an increased risk for chlamydia or gonorrhea. Ask your health care provider if you are at risk.  If you do not have HIV, but are at risk, it may be recommended  that you take a prescription medicine daily to prevent HIV infection. This is called pre-exposure prophylaxis (PrEP). You are considered at risk if: ? You are sexually active and do not regularly use condoms or know the HIV status of your partner(s). ? You take drugs by injection. ? You are sexually active with a partner who has HIV.  Talk with your health care provider about whether you are at high risk of being infected with HIV. If  you choose to begin PrEP, you should first be tested for HIV. You should then be tested every 3 months for as long as you are taking PrEP. Pregnancy  If you are premenopausal and you may become pregnant, ask your health care provider about preconception counseling.  If you may become pregnant, take 400 to 800 micrograms (mcg) of folic acid every day.  If you want to prevent pregnancy, talk to your health care provider about birth control (contraception). Osteoporosis and menopause  Osteoporosis is a disease in which the bones lose minerals and strength with aging. This can result in serious bone fractures. Your risk for osteoporosis can be identified using a bone density scan.  If you are 51 years of age or older, or if you are at risk for osteoporosis and fractures, ask your health care provider if you should be screened.  Ask your health care provider whether you should take a calcium or vitamin D supplement to lower your risk for osteoporosis.  Menopause may have certain physical symptoms and risks.  Hormone replacement therapy may reduce some of these symptoms and risks. Talk to your health care provider about whether hormone replacement therapy is right for you. Follow these instructions at home:  Schedule regular health, dental, and eye exams.  Stay current with your immunizations.  Do not use any tobacco products including cigarettes, chewing tobacco, or electronic cigarettes.  If you are pregnant, do not drink alcohol.  If you are  breastfeeding, limit how much and how often you drink alcohol.  Limit alcohol intake to no more than 1 drink per day for nonpregnant women. One drink equals 12 ounces of beer, 5 ounces of wine, or 1 ounces of hard liquor.  Do not use street drugs.  Do not share needles.  Ask your health care provider for help if you need support or information about quitting drugs.  Tell your health care provider if you often feel depressed.  Tell your health care provider if you have ever been abused or do not feel safe at home. This information is not intended to replace advice given to you by your health care provider. Make sure you discuss any questions you have with your health care provider. Document Released: 02/18/2011 Document Revised: 01/11/2016 Document Reviewed: 05/09/2015 Elsevier Interactive Patient Education  [employer].

## 2018-04-02 NOTE — Progress Notes (Signed)
I have reviewed the documentation from the recent AWV done by Kim Broome; I agree with the documentation and will follow up on any recommendations or abnormal findings as suggested.  

## 2018-04-25 ENCOUNTER — Other Ambulatory Visit: Payer: Self-pay | Admitting: Family Medicine

## 2018-05-06 ENCOUNTER — Ambulatory Visit
Admission: RE | Admit: 2018-05-06 | Discharge: 2018-05-06 | Disposition: A | Discharge status: Home / Self Care | Payer: Medicare Other | Source: Ambulatory Visit | Attending: Family Medicine | Admitting: Family Medicine

## 2018-05-06 DIAGNOSIS — Z1231 Encounter for screening mammogram for malignant neoplasm of breast: Secondary | ICD-10-CM | POA: Diagnosis not present

## 2018-05-06 DIAGNOSIS — Z1239 Encounter for other screening for malignant neoplasm of breast: Secondary | ICD-10-CM

## 2018-06-23 ENCOUNTER — Ambulatory Visit: Payer: Medicare Other | Admitting: Family Medicine

## 2018-06-24 ENCOUNTER — Ambulatory Visit: Payer: Medicare Other | Admitting: Family Medicine

## 2018-06-24 ENCOUNTER — Other Ambulatory Visit: Payer: Self-pay

## 2018-06-24 ENCOUNTER — Encounter: Payer: Self-pay | Admitting: Family Medicine

## 2018-06-24 VITALS — BP 132/80 | HR 82 | Temp 98.6°F | Ht 66.0 in | Wt 170.6 lb

## 2018-06-24 DIAGNOSIS — M222X1 Patellofemoral disorders, right knee: Secondary | ICD-10-CM | POA: Insufficient documentation

## 2018-06-24 DIAGNOSIS — Z23 Encounter for immunization: Secondary | ICD-10-CM | POA: Diagnosis not present

## 2018-06-24 DIAGNOSIS — E039 Hypothyroidism, unspecified: Secondary | ICD-10-CM | POA: Diagnosis not present

## 2018-06-24 DIAGNOSIS — G609 Hereditary and idiopathic neuropathy, unspecified: Secondary | ICD-10-CM | POA: Diagnosis not present

## 2018-06-24 DIAGNOSIS — M1711 Unilateral primary osteoarthritis, right knee: Secondary | ICD-10-CM

## 2018-06-24 LAB — TSH: TSH: 2.52 u[IU]/mL (ref 0.35–4.50)

## 2018-06-24 MED ORDER — LEVOTHYROXINE SODIUM 50 MCG PO TABS: ORAL_TABLET | ORAL | 0 refills | Status: DC

## 2018-06-24 NOTE — Patient Instructions (Addendum)
Please return in march 2020 for your annual complete physical; please come fasting.  Start the leg lifting exercises daily as demonstrated in today's visit.  I will let you know your thyroid results and reorder your prescription if remains at goal.   If you have any questions or concerns, please don't hesitate to send me a message via MyChart or call the office at [phone]. Thank you for visiting with Cathy Lambert today! It's our pleasure caring for you.

## 2018-06-24 NOTE — Progress Notes (Signed)
Subjective  CC:  Chief Complaint  Patient presents with  . Hypothyroidism    doing well, no complaints  . Knee Pain    HPI: Cathy Lambert is a 84 y.o. female who presents to the office today to address the problems listed above in the chief complaint.   Hypothyroidism f/u: Cathy Lambert is a 84 y.o. female who presents for follow up of hypothyroidism. Last TSH showed control was good, and thyroid supplement medication was adjusted accordingly.  Current symptoms: none . Patient denies change in energy level, diarrhea, heat / cold intolerance, nervousness, palpitations and weight changes. Symptoms have been well-controlled.She has been compliant with the medication. Due for lab recheck.  She has been taking a total of 450 mcg of levothyroxine weekly.  The most manageable way to achieve this has been 50 mcg daily with the 100 mcg on Tuesdays and Thursdays.  She is due for refill. Lab Results  Component Value Date   TSH 4.43 02/26/2018    Knee pain: Patient reports about once a month she will have right-sided knee pain after standing from a seated position.  The pain will be achy and last throughout the day while walking.  No swelling, locking, give way.  She did try Voltaren gel without relief.  Does not take any over-the-counter medicines.  Stays active.  Long history of peripheral neuropathy diagnosed by last doctor.  Reports it was not worked up but was thought to be idiopathic.  Mild neuropathy symptoms.  Without pain.  Does not take any medications for it.  Due influence of vaccination.  Due Shingrix, on back order   Wt Readings from Last 3 Encounters:  06/24/18 170 lb 9.6 oz (77.4 kg)  04/01/18 173 lb 6 oz (78.6 kg)  12/22/17 170 lb 3.2 oz (77.2 kg)    Assessment  1. Acquired hypothyroidism   2. Primary osteoarthritis of right knee   3. Patellofemoral pain syndrome of right knee   4. Peripheral neuropathy, idiopathic   5. Need for immunization against influenza      Plan     hypothyroidism: Well-controlled clinically.  Will verify that TSH remains at goal on current dosage.  Will refill if stable.  Peripheral neuropathy, mild.  Supportive care.  Monitor for now.  Intermittent knee pain: Suspect mild osteoarthritis or patellofemoral syndrome.  Since rarely causes problems recommend Advil or Tylenol as needed.  Discussed exercises for VMO strengthening.  Demonstrated in office.  Flu shot updated today.  Patient is on wait list for Shingrix.  Follow up: Return in about 4 months (around 10/23/2018) for complete physical.,  Medicare physical with labs, a annual wellness visit due at that time as well.  Orders Placed This Encounter  Procedures  . Flu Vaccine QUAD 36+ mos IM  . TSH   Meds ordered this encounter  Medications  . levothyroxine (SYNTHROID, LEVOTHROID) 50 MCG tablet    Sig: Take 100mg  on Tu and Th, 50mg  on all other days    Dispense:  120 tablet    Refill:  0      I reviewed the patients updated PMH, FH, and SocHx.    Patient Active Problem List   Diagnosis Date Noted  . Peripheral neuropathy, idiopathic 06/24/2018  . Patellofemoral pain syndrome of right knee 06/24/2018  . Acquired hypothyroidism 10/27/2017  . Migraine headache without aura 10/27/2017  . Primary osteoarthritis of knee    Current Meds  Medication Sig  . cholecalciferol (VITAMIN D) 1000 units tablet  Take 2,000 Units by mouth daily.  [name] Kitchen levothyroxine (SYNTHROID, LEVOTHROID) 50 MCG tablet Take 100mg  on Tu and Th, 50mg  on all other days  . magnesium gluconate (MAGONATE) 500 MG tablet Take 500 mg by mouth 2 (two) times daily.  . [DISCONTINUED] levothyroxine (SYNTHROID, LEVOTHROID) 50 MCG tablet TAKE 1 TABLET (50 MCG TOTAL) BY MOUTH DAILY BEFORE BREAKFAST.  . [DISCONTINUED] Multiple Vitamin (MULTIVITAMIN) tablet Take 1 tablet by mouth daily.    Allergies: Patient has No Known Allergies. Family History: Patient family history includes Arthritis in her mother; Cancer in her  maternal grandmother, mother, paternal grandfather, sister, and son; Diabetes in her brother; Hearing loss in her sister; Stroke in her maternal grandfather. Social History:  Patient  reports that she has never smoked. She has never used smokeless tobacco. She reports that she does not drink alcohol or use drugs.  Review of Systems: Constitutional: Negative for fever malaise or anorexia Cardiovascular: negative for chest pain Respiratory: negative for SOB or persistent cough Gastrointestinal: negative for abdominal pain  Objective  Vitals: BP 132/80   Pulse 82   Temp 98.6 F (37 C)   Ht 5\' 6"  (1.676 m)   Wt 170 lb 9.6 oz (77.4 kg)   SpO2 98%   BMI 27.54 kg/m  General: no acute distress , A&Ox3 HEENT: PEERL, conjunctiva normal, Oropharynx moist,neck is supple Cardiovascular:  RRR without murmur or gallop.  Respiratory:  Good breath sounds bilaterally, CTAB with normal respiratory effort Skin:  Warm, no rashes Neuro, no tremor     Commons side effects, risks, benefits, and alternatives for medications and treatment plan prescribed today were discussed, and the patient expressed understanding of the given instructions. Patient is instructed to call or message via MyChart if he/she has any questions or concerns regarding our treatment plan. No barriers to understanding were identified. We discussed Red Flag symptoms and signs in detail. Patient expressed understanding regarding what to do in case of urgent or emergency type symptoms.   Medication list was reconciled, printed and provided to the patient in AVS. Patient instructions and summary information was reviewed with the patient as documented in the AVS. This note was prepared with assistance of Dragon voice recognition software. Occasional wrong-word or sound-a-like substitutions may have occurred due to the inherent limitations of voice recognition software

## 2018-06-25 ENCOUNTER — Other Ambulatory Visit: Payer: Self-pay | Admitting: Emergency Medicine

## 2018-06-25 MED ORDER — LEVOTHYROXINE SODIUM 50 MCG PO TABS: ORAL_TABLET | ORAL | 3 refills | Status: DC

## 2018-06-25 MED ORDER — LEVOTHYROXINE SODIUM 50 MCG PO TABS: ORAL_TABLET | ORAL | 0 refills | Status: DC

## 2018-06-25 NOTE — Addendum Note (Signed)
Addended by: Asencion Partridge on: 06/25/2018 08:06 AM   Modules accepted: Orders

## 2018-07-05 ENCOUNTER — Other Ambulatory Visit: Payer: Self-pay | Admitting: Family Medicine

## 2018-07-06 NOTE — Telephone Encounter (Signed)
Last refill on 10/30/17 #30 3 RF Last OV: 06/24/18  Please advise

## 2018-10-21 ENCOUNTER — Ambulatory Visit (INDEPENDENT_AMBULATORY_CARE_PROVIDER_SITE_OTHER): Payer: Medicare Other | Admitting: Family Medicine

## 2018-10-21 ENCOUNTER — Other Ambulatory Visit: Payer: Self-pay

## 2018-10-21 ENCOUNTER — Encounter: Payer: Self-pay | Admitting: Family Medicine

## 2018-10-21 VITALS — BP 136/80 | HR 84 | Temp 98.5°F | Resp 16 | Ht 65.25 in | Wt 171.4 lb

## 2018-10-21 DIAGNOSIS — Z0000 Encounter for general adult medical examination without abnormal findings: Secondary | ICD-10-CM | POA: Diagnosis not present

## 2018-10-21 DIAGNOSIS — E2839 Other primary ovarian failure: Secondary | ICD-10-CM

## 2018-10-21 DIAGNOSIS — G609 Hereditary and idiopathic neuropathy, unspecified: Secondary | ICD-10-CM

## 2018-10-21 DIAGNOSIS — E039 Hypothyroidism, unspecified: Secondary | ICD-10-CM

## 2018-10-21 DIAGNOSIS — M8580 Other specified disorders of bone density and structure, unspecified site: Secondary | ICD-10-CM

## 2018-10-21 LAB — MAGNESIUM: MAGNESIUM: 2.3 mg/dL (ref 1.5–2.5)

## 2018-10-21 LAB — COMPREHENSIVE METABOLIC PANEL
ALBUMIN: 4.5 g/dL (ref 3.5–5.2)
ALT: 22 U/L (ref 0–35)
AST: 19 U/L (ref 0–37)
Alkaline Phosphatase: 69 U/L (ref 39–117)
BUN: 18 mg/dL (ref 6–23)
CHLORIDE: 103 meq/L (ref 96–112)
CO2: 26 meq/L (ref 19–32)
Calcium: 9.7 mg/dL (ref 8.4–10.5)
Creatinine, Ser: 1.05 mg/dL (ref 0.40–1.20)
GFR: 49.77 mL/min — ABNORMAL LOW (ref >60.00)
Glucose, Bld: 104 mg/dL — ABNORMAL HIGH (ref 70–99)
POTASSIUM: 4.5 meq/L (ref 3.5–5.1)
SODIUM: 138 meq/L (ref 135–145)
Total Bilirubin: 0.6 mg/dL (ref 0.2–1.2)
Total Protein: 7.1 g/dL (ref 6.0–8.3)

## 2018-10-21 LAB — CBC WITH DIFFERENTIAL/PLATELET
BASOS PCT: 0.8 % (ref 0.0–3.0)
Basophils Absolute: 0.1 10*3/uL (ref 0.0–0.1)
Eosinophils Absolute: 0.2 10*3/uL (ref 0.0–0.7)
Eosinophils Relative: 2.7 % (ref 0.0–5.0)
HCT: 43.2 % (ref 36.0–46.0)
HEMOGLOBIN: 14.5 g/dL (ref 12.0–15.0)
Lymphocytes Relative: 36.7 % (ref 12.0–46.0)
Lymphs Abs: 2.5 10*3/uL (ref 0.7–4.0)
MCHC: 33.5 g/dL (ref 30.0–36.0)
MCV: 91.3 fl (ref 78.0–100.0)
MONO ABS: 0.5 10*3/uL (ref 0.1–1.0)
Monocytes Relative: 7.6 % (ref 3.0–12.0)
NEUTROS ABS: 3.5 10*3/uL (ref 1.4–7.7)
Neutrophils Relative %: 52.2 % (ref 43.0–77.0)
Platelets: 246 10*3/uL (ref 150.0–400.0)
RBC: 4.73 Mil/uL (ref 3.87–5.11)
RDW: 15.3 % (ref 11.5–15.5)
WBC: 6.7 10*3/uL (ref 4.0–10.5)

## 2018-10-21 LAB — LIPID PANEL
CHOL/HDL RATIO: 3
Cholesterol: 202 mg/dL — ABNORMAL HIGH (ref 0–200)
HDL: 80.7 mg/dL (ref >39.00)
LDL CALC: 107 mg/dL — AB (ref 0–99)
NonHDL: 121.16
Triglycerides: 71 mg/dL (ref 0.0–149.0)
VLDL: 14.2 mg/dL (ref 0.0–40.0)

## 2018-10-21 LAB — TSH: TSH: 2.33 u[IU]/mL (ref 0.35–4.50)

## 2018-10-21 MED ORDER — ZOSTER VAC RECOMB ADJUVANTED 50 MCG/0.5ML IM SUSR: 0.5000 mL | Freq: Once | INTRAMUSCULAR | 0 refills | Status: AC

## 2018-10-21 NOTE — Progress Notes (Signed)
Subjective  Chief Complaint  Patient presents with  . Annual Exam    She is fasting, she is asking about getting shingle vaccine  . Hypothyroidism    HPI: Cathy Lambert is a 85 y.o. female who presents to [facility] today for a Female Wellness Visit. She also has the concerns and/or needs as listed above in the chief complaint. These will be addressed in addition to the Health Maintenance Visit.   Wellness Visit: annual visit with health maintenance review and exam    85 year old who continues to thrive.  Says she is feeling great.  Better than ever.  Using some alternative supplements to help with leg cramps.  Knee pain has improved.  Due for Shingrix vaccinations.  Osteopenia by patient report.  Still awaiting records we have requested them.  Will be due for bone density in September with her mammogram.  Chronic disease f/u and/or acute problem visit: (deemed necessary to be done in addition to the wellness visit):  Hypothyroidism: As above, feels well.  Takes medications daily.  He has been well controlled.  Chronic benzo use: At night.  Sleeps well.  No adverse effects.  No falls.  Annual wellness visit due again in August.  Peripheral neuropathy, stable   Assessment  1. Annual physical exam   2. Peripheral neuropathy, idiopathic   3. Acquired hypothyroidism   4. Osteopenia, unspecified location   5. Hypoestrogenism      Plan  Female Wellness Visit:  Age appropriate Health Maintenance and Prevention measures were discussed with patient. Included topics are cancer screening recommendations, ways to keep healthy (see AVS) including dietary and exercise recommendations, regular eye and dental care, use of seat belts, and avoidance of moderate alcohol use and tobacco use.  Discussed continuing breast cancer screening with mammography given her life expectancy is possibly greater than 10 years.  Patient agrees.  BMI: discussed patient's BMI and  encouraged positive lifestyle modifications to help get to or maintain a target BMI.  HM needs and immunizations were addressed and ordered. See below for orders. See HM and immunization section for updates.Patient given prescription to get Shingrix  Routine labs and screening tests ordered including cmp, cbc and lipids where appropriate.  Discussed recommendations regarding Vit D and calcium supplementation (see AVS)  Chronic disease management visit and/or acute problem visit:  Hypothyroidism due to be rechecked.  Clinically euthyroid  Peripheral neuropathy, stable and tolerable  Osteopenia, by patient report.  Continue calcium and vitamin D.  Continue exercising.  Repeat bone density in September  Randy follow up: Return in about 1 year (around 10/21/2019) for complete physical.  Orders Placed This Encounter  Procedures  . DG Bone Density  . Comprehensive metabolic panel  . CBC with Differential/Platelet  . Lipid panel  . TSH  . Magnesium   Meds ordered this encounter  Medications  . Zoster Vaccine Adjuvanted [facility]) injection    Sig: Inject 0.5 mLs into the muscle once for 1 dose. Please give 2nd dose 2-6 months after first dose    Dispense:  2 each    Refill:  0      Lifestyle: Body mass index is 28.3 kg/m. Wt Readings from Last 3 Encounters:  10/21/18 171 lb 6.4 oz (77.7 kg)  06/24/18 170 lb 9.6 oz (77.4 kg)  04/01/18 173 lb 6 oz (78.6 kg)     Patient Active Problem List   Diagnosis Date Noted  . Peripheral neuropathy, idiopathic 06/24/2018  . Patellofemoral  pain syndrome of right knee 06/24/2018  . Acquired hypothyroidism 10/27/2017  . Migraine headache without aura 10/27/2017  . Primary osteoarthritis of knee    Health Maintenance  Topic Date Due  . DEXA SCAN  03/08/2019  . MAMMOGRAM  05/07/2019  . TETANUS/TDAP  06/16/2022  . INFLUENZA VACCINE  Completed  . PNA vac Low Risk Adult  Completed   Immunization History  Administered Date(s)  Administered  . Influenza, High Dose Seasonal PF 06/07/2017  . Influenza,inj,Quad PF,6+ Mos 06/24/2018  . Zoster 11/06/2007   We updated and reviewed the patient's past history in detail and it is documented below. Allergies: Patient has No Known Allergies. Past Medical History Patient  has a past medical history of Migraines and Primary osteoarthritis of knee. Past Surgical History Patient  has a past surgical history that includes Bunionectomy (1991); Cataract extraction (2008-2009); and Tonsillectomy and adenoidectomy (1953). Family History: Patient family history includes Arthritis in her mother; Cancer in her maternal grandmother, mother, paternal grandfather, sister, and son; Diabetes in her brother; Hearing loss in her sister; Stroke in her maternal grandfather. Social History:  Patient  reports that she has never smoked. She has never used smokeless tobacco. She reports that she does not drink alcohol or use drugs.  Review of Systems: Constitutional: negative for fever or malaise Ophthalmic: negative for photophobia, double vision or loss of vision Cardiovascular: negative for chest pain, dyspnea on exertion, or new LE swelling Respiratory: negative for SOB or persistent cough Gastrointestinal: negative for abdominal pain, change in bowel habits or melena Genitourinary: negative for dysuria or gross hematuria, no abnormal uterine bleeding or disharge Musculoskeletal: negative for new gait disturbance or muscular weakness Integumentary: negative for new or persistent rashes, no breast lumps Neurological: negative for TIA or stroke symptoms Psychiatric: negative for SI or delusions Allergic/Immunologic: negative for hives  Patient Care Team    Relationship Specialty Notifications Start End  Willow Ora, MD PCP - General Family Medicine  10/27/17     Objective  Vitals: BP 136/80   Pulse 84   Temp 98.5 F (36.9 C) (Oral)   Resp 16   Ht 5' 5.25" (1.657 m)   Wt 171 lb  6.4 oz (77.7 kg)   SpO2 96%   BMI 28.30 kg/m  General:  Well developed, well nourished, no acute distress  Psych:  Alert and orientedx3,normal mood and affect HEENT:  Normocephalic, atraumatic, non-icteric sclera, PERRL, oropharynx is clear without mass or exudate, supple neck without adenopathy, mass or thyromegaly Cardiovascular:  Normal S1, S2, RRR without gallop, rub or murmur, nondisplaced PMI Respiratory:  Good breath sounds bilaterally, CTAB with normal respiratory effort Gastrointestinal: normal bowel sounds, soft, non-tender, no noted masses. No HSM MSK: no deformities, contusions. Joints are without erythema or swelling. Spine and CVA region are nontender Skin:  Warm, no rashes or suspicious lesions noted Neurologic:    Mental status is normal. CN 2-11 are normal. Gross motor and sensory exams are normal. Normal gait. No tremor    Commons side effects, risks, benefits, and alternatives for medications and treatment plan prescribed today were discussed, and the patient expressed understanding of the given instructions. Patient is instructed to call or message via MyChart if he/she has any questions or concerns regarding our treatment plan. No barriers to understanding were identified. We discussed Red Flag symptoms and signs in detail. Patient expressed understanding regarding what to do in case of urgent or emergency type symptoms.   Medication list was reconciled, printed and provided  to the patient in AVS. Patient instructions and summary information was reviewed with the patient as documented in the AVS. This note was prepared with assistance of Dragon voice recognition software. Occasional wrong-word or sound-a-like substitutions may have occurred due to the inherent limitations of voice recognition software

## 2018-10-21 NOTE — Patient Instructions (Signed)
Please return in 12 months for your annual complete physical; please come fasting.  I have ordered a bone density to be done in September at [facility] with your mammogram.   Please go and get your shingrix vaccinations. I have printed the RX for you.   I will release your lab results to you on your MyChart account with further instructions. Please reply with any questions.    If you have any questions or concerns, please don't hesitate to send me a message via MyChart or call the office at [phone]. Thank you for visiting with Cathy Lambert today! It's our pleasure caring for you.  Keep doing great. You are AMAZING!!

## 2018-10-28 ENCOUNTER — Telehealth: Payer: Self-pay | Admitting: Family Medicine

## 2018-10-28 DIAGNOSIS — Z1239 Encounter for other screening for malignant neoplasm of breast: Secondary | ICD-10-CM

## 2018-10-28 DIAGNOSIS — M8580 Other specified disorders of bone density and structure, unspecified site: Secondary | ICD-10-CM

## 2018-10-28 NOTE — Telephone Encounter (Signed)
Sent to the [facility] for an appt. To be scheduled.

## 2018-10-28 NOTE — Telephone Encounter (Signed)
I have fixed orders

## 2018-10-28 NOTE — Telephone Encounter (Signed)
Can we put in an order for the pt to go to [facility] for her Mammogram and Dexa scan. Pt states that in 2019 she went to [facility] not [place].

## 2018-12-28 DIAGNOSIS — Z0120 Encounter for dental examination and cleaning without abnormal findings: Secondary | ICD-10-CM | POA: Diagnosis not present

## 2019-03-04 ENCOUNTER — Ambulatory Visit (HOSPITAL_COMMUNITY)
Admission: EM | Admit: 2019-03-04 | Discharge: 2019-03-04 | Disposition: A | Discharge status: Home / Self Care | Payer: Medicare Other | Source: Home / Self Care

## 2019-03-04 ENCOUNTER — Encounter (HOSPITAL_COMMUNITY): Payer: Self-pay | Admitting: Emergency Medicine

## 2019-03-04 ENCOUNTER — Inpatient Hospital Stay (HOSPITAL_COMMUNITY)
Admission: EM | Admit: 2019-03-04 | Discharge: 2019-03-09 | DRG: 309 | Disposition: A | Discharge status: Home / Self Care | Payer: Medicare Other | Source: Other Acute Inpatient Hospital | Attending: Family Medicine | Admitting: Family Medicine

## 2019-03-04 ENCOUNTER — Emergency Department (HOSPITAL_COMMUNITY): Payer: Medicare Other

## 2019-03-04 ENCOUNTER — Other Ambulatory Visit: Payer: Self-pay

## 2019-03-04 ENCOUNTER — Telehealth: Payer: Self-pay | Admitting: Family Medicine

## 2019-03-04 ENCOUNTER — Ambulatory Visit: Payer: Self-pay

## 2019-03-04 DIAGNOSIS — I472 Ventricular tachycardia: Secondary | ICD-10-CM

## 2019-03-04 DIAGNOSIS — R000 Tachycardia, unspecified: Secondary | ICD-10-CM

## 2019-03-04 DIAGNOSIS — Z1159 Encounter for screening for other viral diseases: Secondary | ICD-10-CM | POA: Diagnosis not present

## 2019-03-04 DIAGNOSIS — Z823 Family history of stroke: Secondary | ICD-10-CM

## 2019-03-04 DIAGNOSIS — Z8261 Family history of arthritis: Secondary | ICD-10-CM

## 2019-03-04 DIAGNOSIS — N179 Acute kidney failure, unspecified: Secondary | ICD-10-CM | POA: Diagnosis present

## 2019-03-04 DIAGNOSIS — E872 Acidosis: Secondary | ICD-10-CM | POA: Diagnosis not present

## 2019-03-04 DIAGNOSIS — I4892 Unspecified atrial flutter: Secondary | ICD-10-CM | POA: Diagnosis not present

## 2019-03-04 DIAGNOSIS — I351 Nonrheumatic aortic (valve) insufficiency: Secondary | ICD-10-CM | POA: Diagnosis not present

## 2019-03-04 DIAGNOSIS — G4700 Insomnia, unspecified: Secondary | ICD-10-CM | POA: Diagnosis not present

## 2019-03-04 DIAGNOSIS — M1710 Unilateral primary osteoarthritis, unspecified knee: Secondary | ICD-10-CM | POA: Diagnosis present

## 2019-03-04 DIAGNOSIS — Z833 Family history of diabetes mellitus: Secondary | ICD-10-CM | POA: Diagnosis not present

## 2019-03-04 DIAGNOSIS — I4891 Unspecified atrial fibrillation: Secondary | ICD-10-CM | POA: Diagnosis not present

## 2019-03-04 DIAGNOSIS — Z79890 Hormone replacement therapy (postmenopausal): Secondary | ICD-10-CM | POA: Diagnosis not present

## 2019-03-04 DIAGNOSIS — I4430 Unspecified atrioventricular block: Secondary | ICD-10-CM | POA: Diagnosis not present

## 2019-03-04 DIAGNOSIS — I083 Combined rheumatic disorders of mitral, aortic and tricuspid valves: Secondary | ICD-10-CM | POA: Diagnosis not present

## 2019-03-04 DIAGNOSIS — I959 Hypotension, unspecified: Secondary | ICD-10-CM | POA: Diagnosis not present

## 2019-03-04 DIAGNOSIS — I471 Supraventricular tachycardia: Principal | ICD-10-CM | POA: Diagnosis present

## 2019-03-04 DIAGNOSIS — Z03818 Encounter for observation for suspected exposure to other biological agents ruled out: Secondary | ICD-10-CM | POA: Diagnosis not present

## 2019-03-04 DIAGNOSIS — I340 Nonrheumatic mitral (valve) insufficiency: Secondary | ICD-10-CM | POA: Diagnosis not present

## 2019-03-04 DIAGNOSIS — I088 Other rheumatic multiple valve diseases: Secondary | ICD-10-CM | POA: Diagnosis not present

## 2019-03-04 DIAGNOSIS — N183 Chronic kidney disease, stage 3 (moderate): Secondary | ICD-10-CM | POA: Diagnosis present

## 2019-03-04 DIAGNOSIS — I447 Left bundle-branch block, unspecified: Secondary | ICD-10-CM | POA: Diagnosis present

## 2019-03-04 DIAGNOSIS — G609 Hereditary and idiopathic neuropathy, unspecified: Secondary | ICD-10-CM | POA: Diagnosis present

## 2019-03-04 DIAGNOSIS — R079 Chest pain, unspecified: Secondary | ICD-10-CM | POA: Diagnosis not present

## 2019-03-04 DIAGNOSIS — E039 Hypothyroidism, unspecified: Secondary | ICD-10-CM | POA: Diagnosis present

## 2019-03-04 DIAGNOSIS — Z79899 Other long term (current) drug therapy: Secondary | ICD-10-CM | POA: Diagnosis not present

## 2019-03-04 DIAGNOSIS — I4819 Other persistent atrial fibrillation: Secondary | ICD-10-CM | POA: Diagnosis not present

## 2019-03-04 HISTORY — DX: Disorder of thyroid, unspecified: E07.9

## 2019-03-04 LAB — COMPREHENSIVE METABOLIC PANEL
ALT: 45 U/L — ABNORMAL HIGH (ref 0–44)
AST: 34 U/L (ref 15–41)
Albumin: 3.9 g/dL (ref 3.5–5.0)
Alkaline Phosphatase: 77 U/L (ref 38–126)
Anion gap: 11 (ref 5–15)
BUN: 19 mg/dL (ref 8–23)
CO2: 21 mmol/L — ABNORMAL LOW (ref 22–32)
Calcium: 9 mg/dL (ref 8.9–10.3)
Chloride: 108 mmol/L (ref 98–111)
Creatinine, Ser: 1.2 mg/dL — ABNORMAL HIGH (ref 0.44–1.00)
GFR calc Af Amer: 48 mL/min — ABNORMAL LOW (ref >60)
GFR calc non Af Amer: 41 mL/min — ABNORMAL LOW (ref >60)
Glucose, Bld: 105 mg/dL — ABNORMAL HIGH (ref 70–99)
Potassium: 4 mmol/L (ref 3.5–5.1)
Sodium: 140 mmol/L (ref 135–145)
Total Bilirubin: 0.5 mg/dL (ref 0.3–1.2)
Total Protein: 6.6 g/dL (ref 6.5–8.1)

## 2019-03-04 LAB — CBC
HCT: 40.8 % (ref 36.0–46.0)
Hemoglobin: 13.4 g/dL (ref 12.0–15.0)
MCH: 30.2 pg (ref 26.0–34.0)
MCHC: 32.8 g/dL (ref 30.0–36.0)
MCV: 91.9 fL (ref 80.0–100.0)
Platelets: 239 10*3/uL (ref 150–400)
RBC: 4.44 MIL/uL (ref 3.87–5.11)
RDW: 15.4 % (ref 11.5–15.5)
WBC: 8.3 10*3/uL (ref 4.0–10.5)
nRBC: 0 % (ref 0.0–0.2)

## 2019-03-04 LAB — MAGNESIUM: Magnesium: 2.3 mg/dL (ref 1.7–2.4)

## 2019-03-04 LAB — PHOSPHORUS: Phosphorus: 3.7 mg/dL (ref 2.5–4.6)

## 2019-03-04 LAB — TSH: TSH: 0.84 u[IU]/mL (ref 0.350–4.500)

## 2019-03-04 LAB — SARS CORONAVIRUS 2 BY RT PCR (HOSPITAL ORDER, PERFORMED IN ~~LOC~~ HOSPITAL LAB): SARS Coronavirus 2: NEGATIVE

## 2019-03-04 MED ORDER — AMIODARONE HCL IN DEXTROSE 360-4.14 MG/200ML-% IV SOLN
30.0000 mg/h | INTRAVENOUS | Status: DC
  Administered 2019-03-05 – 2019-03-08 (×7): 30 mg/h via INTRAVENOUS
  Filled 2019-03-04 (×7): qty 200

## 2019-03-04 MED ORDER — AMIODARONE LOAD VIA INFUSION
150.0000 mg | Freq: Once | INTRAVENOUS | Status: AC
  Administered 2019-03-04: 150 mg via INTRAVENOUS
  Filled 2019-03-04: qty 83.34

## 2019-03-04 MED ORDER — AMIODARONE HCL IN DEXTROSE 360-4.14 MG/200ML-% IV SOLN
INTRAVENOUS | Status: AC
  Filled 2019-03-04: qty 200

## 2019-03-04 MED ORDER — AMIODARONE HCL IN DEXTROSE 360-4.14 MG/200ML-% IV SOLN
60.0000 mg/h | INTRAVENOUS | Status: AC
  Administered 2019-03-04 (×2): 60 mg/h via INTRAVENOUS
  Filled 2019-03-04: qty 200

## 2019-03-04 NOTE — ED Triage Notes (Signed)
Pt sts noted that her HR was high 147bpm x 6 days on her BP machine; pt denies any sx; pt denies hx of same

## 2019-03-04 NOTE — ED Provider Notes (Signed)
[place] EMERGENCY DEPARTMENT Provider Note   CSN: [id number] Arrival date & time: 03/04/19  1824     History   Chief Complaint Chief Complaint  Patient presents with  . Tachycardia    HPI Cathy Lambert is a 85 y.o. female.     HPI Patient is an 85 year old female presents to the emergency department with complaints of rapid heart rate.  Over the past 4 days she has been monitoring her heart rate at home and is been in the 140 range.  She is tried to relax and deep breathe and do yoga exercises without improvement in her symptoms.  No prior history of SVT or atrial fibrillation.  No prior history of cardiac disease.  She has no palpitations.  She has no chest pain or shortness of breath.  No lightheadedness or syncope.  She presented to urgent care and was found to be in a wide-complex tachycardia with a rate in the 140s and was sent to the ER for further evaluation.  Other than her heart rate she is without symptoms at this time.  She is cheerful.  She has no complaints.   Past Medical History:  Diagnosis Date  . Migraines   . Primary osteoarthritis of knee   . Thyroid disease     Patient Active Problem List   Diagnosis Date Noted  . Peripheral neuropathy, idiopathic 06/24/2018  . Patellofemoral pain syndrome of right knee 06/24/2018  . Acquired hypothyroidism 10/27/2017  . Migraine headache without aura 10/27/2017  . Primary osteoarthritis of knee     Past Surgical History:  Procedure Laterality Date  . BUNIONECTOMY  1991  . CATARACT EXTRACTION  2008-2009  . TONSILLECTOMY AND ADENOIDECTOMY  1953     OB History   No obstetric history on file.      Home Medications    Prior to Admission medications   Medication Sig Start Date End Date Taking? Authorizing Provider  B Complex Vitamins (VITAMIN B COMPLEX PO) Take by mouth.    [provider]  cholecalciferol (VITAMIN D) 1000 units tablet Take 2,000 Units by mouth daily.    [provider]  levothyroxine (SYNTHROID, LEVOTHROID) 50 MCG tablet Take 100mg  on Tu and Th, 50mg  on all other days 06/25/18   Leamon Arnt, MD  LORazepam (ATIVAN) 2 MG tablet TAKE 0.5 TABLETS (1 MG TOTAL) BY MOUTH AT BEDTIME AS NEEDED FOR SLEEP. 07/07/18 07/07/19  Leamon Arnt, MD  magnesium gluconate (MAGONATE) 500 MG tablet Take 500 mg by mouth 2 (two) times daily.    [provider]  Probiotic Product (PROBIOTIC DAILY PO) Take by mouth.    [provider]    Family History Family History  Problem Relation Age of Onset  . Arthritis Mother   . Cancer Mother   . Cancer Sister   . Diabetes Brother   . Cancer Maternal Grandmother   . Stroke Maternal Grandfather   . Cancer Paternal Grandfather   . Hearing loss Sister   . Cancer Son     Social History Social History   Tobacco Use  . Smoking status: Never Smoker  . Smokeless tobacco: Never Used  Substance Use Topics  . Alcohol use: No    Frequency: Never  . Drug use: No     Allergies   Patient has no known allergies.   Review of Systems Review of Systems  All other systems reviewed and are negative.    Physical Exam Updated Vital Signs  BP (!) 127/99   Pulse (!) 133   Temp 97.8 F (36.6 C) (Temporal)   Resp 20   SpO2 97%   Physical Exam Vitals signs and nursing note reviewed.  Constitutional:      General: She is not in acute distress.    Appearance: She is well-developed.  HENT:     Head: Normocephalic and atraumatic.  Neck:     Musculoskeletal: Normal range of motion.  Cardiovascular:     Rate and Rhythm: Regular rhythm. Tachycardia present.     Pulses: Normal pulses.     Heart sounds: Normal heart sounds.  Pulmonary:     Effort: Pulmonary effort is normal.     Breath sounds: Normal breath sounds.  Abdominal:     General: There is no distension.     Palpations: Abdomen is soft.     Tenderness: There is no abdominal tenderness.  Musculoskeletal: Normal range of motion.   Skin:    General: Skin is warm and dry.  Neurological:     Mental Status: She is alert and oriented to person, place, and time.  Psychiatric:        Judgment: Judgment normal.      ED Treatments / Results  Labs (all labs ordered are listed, but only abnormal results are displayed) Labs Reviewed  COMPREHENSIVE METABOLIC PANEL - Abnormal; Notable for the following components:      Result Value   CO2 21 (*)    Glucose, Bld 105 (*)    Creatinine, Ser 1.20 (*)    ALT 45 (*)    GFR calc non Af Amer 41 (*)    GFR calc Af Amer 48 (*)    All other components within normal limits  SARS CORONAVIRUS 2 (HOSPITAL ORDER, PERFORMED IN [facility])  CBC  MAGNESIUM  PHOSPHORUS  TSH    EKG EKG Interpretation  Date/Time:  Thursday March 04 2019 18:31:38 EDT Ventricular Rate:  146 PR Interval:    QRS Duration: 127 QT Interval:  327 QTC Calculation: 510 R Axis:   -41 Text Interpretation:  Wide-QRS tachycardia Left bundle branch block unchanged from prior obtained same day at 1807 Confirmed by Azalia Bilisampos, Melony Tenpas ([id number]) on 03/04/2019 7:17:08 PM   Radiology Dg Chest Portable 1 View  Result Date: 03/04/2019 CLINICAL DATA:  Pt states she has had a high heart rate for 6 days. No other issues. Denies chest pain and SOB. No known heart or lung issues or surgeries. Pt is a nonsmoker. EXAM: PORTABLE CHEST 1 VIEW COMPARISON:  02/13/2011 FINDINGS: The heart size and mediastinal contours are within normal limits. Both lungs are clear. The visualized skeletal structures are unremarkable. IMPRESSION: No active disease. Electronically Signed   By: Norva PavlovElizabeth  Brown M.D.   On: 03/04/2019 19:57    Procedures .Critical Care Performed by: Azalia Bilisampos, Mihailo Sage, MD Authorized by: Azalia Bilisampos, Ellery Meroney, MD   Critical care provider statement:    Critical care time (minutes):  35   Critical care was time spent personally by me on the following activities:  Discussions with consultants, evaluation of patient's  response to treatment, examination of patient, ordering and performing treatments and interventions, ordering and review of laboratory studies, ordering and review of radiographic studies, pulse oximetry, re-evaluation of patient's condition, obtaining history from patient or surrogate and review of old charts   (including critical care time)  Medications Ordered in ED Medications  amiodarone (NEXTERONE PREMIX) 360-4.14 MG/200ML-% (1.8 mg/mL) IV infusion (60 mg/hr Intravenous New Bag/Given 03/04/19 2005)  amiodarone (NEXTERONE PREMIX) 360-4.14 MG/200ML-% (1.8 mg/mL) IV infusion (has no administration in time range)  amiodarone (NEXTERONE) 1.8 mg/mL load via infusion 150 mg (150 mg Intravenous Bolus from Bag 03/04/19 1937)     Initial Impression / Assessment and Plan / ED Course  I have reviewed the triage vital signs and the nursing notes.  Pertinent labs & imaging results that were available during my care of the patient were reviewed by me and considered in my medical decision making (see chart for details).       Wide-complex tachycardia without syncope or palpitations.  She has no prior EKGs in our system.  No history of A. fib or SVT.  Given the wide complex nature of her tachycardia I decision was made to avoid calcium channel blockers initially and start with amiodarone.  With amiodarone she has dropped her rate into the 130s but remains wide and regular.  Could represent SVT with aberrancy versus atrial flutter with block versus slow VT  .9:58 PM Spoke with Dr Charna Busmanruby of cardiology who agrees with amiodarone at this time and admission the hospital.  Agrees this could represent SVT with aberrancy.  Recommends standard work-up and echocardiogram in the morning.  She will see the patient in consultation.  Hospitalist admission  Patient's daughter updated as to the findings here in the emergency department and the need for admission to the hospital.  Patient and daughter agreeable to  admission and additional work-up..  Final Clinical Impressions(s) / ED Diagnoses   Final diagnoses:  Wide-complex tachycardia [facility](HCC)    ED Discharge Orders    None       Azalia Bilisampos, Deshannon Hinchliffe, MD 03/04/19 2159

## 2019-03-04 NOTE — Progress Notes (Signed)
  Amiodarone Drug - Drug Interaction Consult Note  Recommendations: No major interactions noted. Amiodarone is metabolized by the cytochrome P450 system and therefore has the potential to cause many drug interactions. Amiodarone has an average plasma half-life of 50 days (range 20 to 100 days).   There is potential for drug interactions to occur several weeks or months after stopping treatment and the onset of drug interactions may be slow after initiating amiodarone.   []  Statins: Increased risk of myopathy. Simvastatin- restrict dose to 20mg  daily. Other statins: counsel patients to report any muscle pain or weakness immediately.  []  Anticoagulants: Amiodarone can increase anticoagulant effect. Consider warfarin dose reduction. Patients should be monitored closely and the dose of anticoagulant altered accordingly, remembering that amiodarone levels take several weeks to stabilize.  []  Antiepileptics: Amiodarone can increase plasma concentration of phenytoin, the dose should be reduced. Note that small changes in phenytoin dose can result in large changes in levels. Monitor patient and counsel on signs of toxicity.  []  Beta blockers: increased risk of bradycardia, AV block and myocardial depression. Sotalol - avoid concomitant use.  []   Calcium channel blockers (diltiazem and verapamil): increased risk of bradycardia, AV block and myocardial depression.  []   Cyclosporine: Amiodarone increases levels of cyclosporine. Reduced dose of cyclosporine is recommended.  []  Digoxin dose should be halved when amiodarone is started.  []  Diuretics: increased risk of cardiotoxicity if hypokalemia occurs.  []  Oral hypoglycemic agents (glyburide, glipizide, glimepiride): increased risk of hypoglycemia. Patient's glucose levels should be monitored closely when initiating amiodarone therapy.   []  Drugs that prolong the QT interval:  Torsades de pointes risk may be increased with concurrent use - avoid if  possible.  Monitor QTc, also keep magnesium/potassium WNL if concurrent therapy can't be avoided. [name] Kitchen Antibiotics: e.g. fluoroquinolones, erythromycin. . Antiarrhythmics: e.g. quinidine, procainamide, disopyramide, sotalol. . Antipsychotics: e.g. phenothiazines, haloperidol.  . Lithium, tricyclic antidepressants, and methadone. Thank You,  Elicia Lamp P  03/04/2019 7:14 PM

## 2019-03-04 NOTE — ED Triage Notes (Signed)
Pt sent by [facility] for further evaluation of tachycardia. Pt reports she has been monitoring BP and HR at home, noted HR elevated x 6 days. Denies shortness of breath or chest pain.

## 2019-03-04 NOTE — Telephone Encounter (Signed)
Need to call pt to triage her.  If HR is persistently in 140s, or palpitations, sob or cp: needs ER.  Otherwise bring in tomorrow.

## 2019-03-04 NOTE — Telephone Encounter (Signed)
Pt has triage note in chart. Soonest appt with Jonni Sanger was Monday. Pt would like to talk to someone clinical in reference to this prior to Monday. Please advise.

## 2019-03-04 NOTE — Telephone Encounter (Signed)
Incoming call from Patient with a complaint or iregular heart beats Patient states that for the past 6 to 7 days hear rates range from 107 to 148.   Blood pressure ranges from  110/66 to 129/81Patient denies chest pain , shortness of breath. Current blood Pressure during call was 149/100 Heart  144.  Denies Cardiac or any other symtoms. Reviewed Protocol and provided care advise.  Transferred call to [place] to scheduled appointment.          Reason for Disposition . [1] Skipped or extra beat(s) AND [2] occurs < 4 times / minute  Answer Assessment - Initial Assessment Questions 1. DESCRIPTION: "Please describe your heart rate or heart beat that you are having" (e.g., fast/slow, regular/irregular, skipped or extra beats, "palpitations")   Noticing with monitor 2. ONSET: "When did it start?" (Minutes, hours or days)       A week  ago 3. DURATION: "How long does it last" (e.g., seconds, minutes, hours)       4. PATTERN "Does it come and go, or has it been constant since it started?"  "Does it get worse with exertion?"   "Are you feeling it now?"    current 5. TAP: "Using your hand, can you tap out what you are feeling on a chair or table in front of you, so that I can hear?" (Note: not all patients can do this)       *No Answer* 6. HEART RATE: "Can you tell me your heart rate?" "How many beats in 15 seconds?"  (Note: not all patients can do this)       149/100 heart rate 144 7. RECURRENT SYMPTOM: "Have you ever had this before?" If so, ask: "When was the last time?" and "What happened that time?"      denies 8. CAUSE: "What do you think is causing the palpitations?"     *No Answer* 9. CARDIAC HISTORY: "Do you have any history of heart disease?" (e.g., heart attack, angina, bypass surgery, angioplasty, arrhythmia)      denies 10. OTHER SYMPTOMS: "Do you have any other symptoms?" (e.g., dizziness, chest pain, sweating, difficulty breathing)    Denies, don't feel bad at all 11.  PREGNANCY: "Is there any chance you are pregnant?" "When was your last menstrual period?"       denies  Protocols used: HEART RATE AND HEARTBEAT QUESTIONS-A-AH

## 2019-03-04 NOTE — Telephone Encounter (Signed)
Can she be added to 4pm in office tomorrow or ok to wait until Monday?  Incoming call from Patient with a complaint or iregular heart beats Patient states that for the past 6 to 7 days heart rates range from 107 to 148.   Blood pressure ranges from  110/66 to 129/81Patient denies chest pain , shortness of breath. Current blood Pressure during call was 149/100 Heart  144.  Denies Cardiac or any other symtoms.

## 2019-03-04 NOTE — ED Notes (Signed)
ED TO INPATIENT HANDOFF REPORT  ED Nurse Name and Phone #: [phone]  S Name/Age/Gender Cathy Lambert 85 y.o. female Room/Bed: 035C/035C  Code Status   Code Status: Not on file  Home/SNF/Other Home Patient oriented to: self, place, time and situation Is this baseline? Yes   Triage Complete: Triage complete  Chief Complaint tachycardia  [facility]. Pt reports she has been monitoring BP and HR at home, noted HR elevated x 6 days. Denies shortness of breath or chest pain.    Allergies No Known Allergies  Level of Care/Admitting Diagnosis ED Disposition    None      B Medical/Surgery History Past Medical History:  Diagnosis Date  . Migraines   . Primary osteoarthritis of knee   . Thyroid disease    Past Surgical History:  Procedure Laterality Date  . BUNIONECTOMY  1991  . CATARACT EXTRACTION  2008-2009  . TONSILLECTOMY AND ADENOIDECTOMY  1953     A IV Location/Drains/Wounds Patient Lines/Drains/Airways Status   Active Line/Drains/Airways    Name:   Placement date:   Placement time:   Site:   Days:   Peripheral IV 03/04/19 Antecubital   03/04/19    1836    Antecubital   less than 1          Intake/Output Last 24 hours No intake or output data in the 24 hours ending 03/04/19 2125  Labs/Imaging No results found for this or any previous visit (from the past 48 hour(s)). Dg Chest Portable 1 View  Result Date: 03/04/2019 CLINICAL DATA:  Pt states she has had a high heart rate for 6 days. No other issues. Denies chest pain and SOB. No known heart or lung issues or surgeries. Pt is a nonsmoker. EXAM: PORTABLE CHEST 1 VIEW COMPARISON:  02/13/2011 FINDINGS: The heart size and mediastinal contours are within normal limits. Both lungs are clear. The visualized skeletal structures are unremarkable. IMPRESSION: No active disease. Electronically Signed   By: Norva PavlovElizabeth  Brown M.D.   On: 03/04/2019 19:57    Pending  Labs Unresulted Labs (From admission, onward)    Start     Ordered   03/04/19 2123  SARS Coronavirus 2 (CE[facility]HEID [facility] Hosp Order  (COVID Labs)  ONCE - STAT,   STAT    Question:  Pre-procedural testing  Answer:  Yes   03/04/19 2122   03/04/19 2100  CBC  Once,   STAT     03/04/19 2100   03/04/19 2100  Comprehensive metabolic panel  Once,   STAT     03/04/19 2100   03/04/19 2100  Magnesium  Once,   STAT     03/04/19 2100   03/04/19 2100  Phosphorus  Once,   STAT     03/04/19 2100   03/04/19 2100  TSH  Once,   R     03/04/19 2100          Vitals/Pain Today's Vitals   03/04/19 1930 03/04/19 1945 03/04/19 2015 03/04/19 2030  BP: 123/85 129/87 124/87 (!) 132/96  Pulse: (!) 144  (!) 135 (!) 136  Resp: 13 (!) 24 12 (!) 24  Temp:      TempSrc:      SpO2: 95% 97% 98% 97%  PainSc:        Isolation Precautions No active isolations  Medications Medications  amiodarone (NEXTERONE PREMIX) 360-4.14 MG/200ML-% (1.8 mg/mL) IV infusion (60 mg/hr Intravenous  New Bag/Given 03/04/19 2005)  amiodarone (NEXTERONE PREMIX) 360-4.14 MG/200ML-% (1.8 mg/mL) IV infusion (has no administration in time range)  amiodarone (NEXTERONE) 1.8 mg/mL load via infusion 150 mg (150 mg Intravenous Bolus from Bag 03/04/19 1937)    Mobility walks Low fall risk   Focused Assessments Cardiac Assessment Handoff:  Cardiac Rhythm: Atrial fibrillation No results found for: CKTOTAL, CKMB, CKMBINDEX, TROPONINI No results found for: DDIMER Does the Patient currently have chest pain? No      R Recommendations: See Admitting Provider Note  Report given to:   Additional Notes: asymptomatic tachycardia, no complaints.

## 2019-03-04 NOTE — ED Notes (Signed)
Pharmacy tech at bedside 

## 2019-03-04 NOTE — ED Provider Notes (Addendum)
[facility]    CSN: [id number] Arrival date & time: 03/04/19  1739      History   Chief Complaint Chief Complaint  Patient presents with  . Tachycardia    HPI Cathy Lambert is a 85 y.o. female with no past medical history comes to urgent care with complaints of elevated heart rate over the past week.  Patient says that symptoms started about 6 days ago.  She denied any chest pain or chest pressure.  No dizziness, near syncope or syncopal episode.  She tried yoga and other relaxation techniques without any improvement.  No nausea or vomiting.  In the urgent care, blood pressure was 131/65 with a heart rate of 147.  EKG done shows wide-complex tachycardia  HPI  Past Medical History:  Diagnosis Date  . Migraines   . Primary osteoarthritis of knee     Patient Active Problem List   Diagnosis Date Noted  . Peripheral neuropathy, idiopathic 06/24/2018  . Patellofemoral pain syndrome of right knee 06/24/2018  . Acquired hypothyroidism 10/27/2017  . Migraine headache without aura 10/27/2017  . Primary osteoarthritis of knee     Past Surgical History:  Procedure Laterality Date  . BUNIONECTOMY  1991  . CATARACT EXTRACTION  2008-2009  . TONSILLECTOMY AND ADENOIDECTOMY  1953    OB History   No obstetric history on file.      Home Medications    Prior to Admission medications   Medication Sig Start Date End Date Taking? Authorizing Provider  B Complex Vitamins (VITAMIN B COMPLEX PO) Take by mouth.    [provider]  cholecalciferol (VITAMIN D) 1000 units tablet Take 2,000 Units by mouth daily.    [provider]  levothyroxine (SYNTHROID, LEVOTHROID) 50 MCG tablet Take 100mg  on Tu and Th, 50mg  on all other days 06/25/18   Willow OraAndy, Camille L, MD  LORazepam (ATIVAN) 2 MG tablet TAKE 0.5 TABLETS (1 MG TOTAL) BY MOUTH AT BEDTIME AS NEEDED FOR SLEEP. 07/07/18 07/07/19  Willow OraAndy, Camille L, MD  magnesium gluconate (MAGONATE) 500 MG tablet Take 500 mg by  mouth 2 (two) times daily.    [provider]  Probiotic Product (PROBIOTIC DAILY PO) Take by mouth.    [provider]    Family History Family History  Problem Relation Age of Onset  . Arthritis Mother   . Cancer Mother   . Cancer Sister   . Diabetes Brother   . Cancer Maternal Grandmother   . Stroke Maternal Grandfather   . Cancer Paternal Grandfather   . Hearing loss Sister   . Cancer Son     Social History Social History   Tobacco Use  . Smoking status: Never Smoker  . Smokeless tobacco: Never Used  Substance Use Topics  . Alcohol use: No    Frequency: Never  . Drug use: No     Allergies   Patient has no known allergies.   Review of Systems Review of Systems  Constitutional: Negative.   HENT: Negative.   Respiratory: Negative.   Cardiovascular: Negative.   Gastrointestinal: Negative.   Neurological: Negative.  Negative for dizziness, syncope, weakness and headaches.  Psychiatric/Behavioral: Negative for confusion and decreased concentration.  All other systems reviewed and are negative.    Physical Exam Triage Vital Signs ED Triage Vitals  Enc Vitals Group     BP 03/04/19 1811 131/65     Pulse Rate 03/04/19 1811 (!) 147     Resp 03/04/19 1811 18  Temp 03/04/19 1811 98.3 F (36.8 C)     Temp Source 03/04/19 1811 Oral     SpO2 03/04/19 1811 95 %     Weight --      Height --      Head Circumference --      Peak Flow --      Pain Score 03/04/19 1818 0     Pain Loc --      Pain Edu? --      Excl. in [place]? --    No data found.  Updated Vital Signs BP 131/65 (BP Location: Right Arm)   Pulse (!) 147   Temp 98.3 F (36.8 C) (Oral)   Resp 18   SpO2 95%   Visual Acuity Right Eye Distance:   Left Eye Distance:   Bilateral Distance:    Right Eye Near:   Left Eye Near:    Bilateral Near:     Physical Exam   UC Treatments / Results  Labs (all labs ordered are listed, but only abnormal results are displayed) Labs  Reviewed - No data to display  EKG   Radiology No results found.  Procedures Procedures (including critical care time)  Medications Ordered in UC Medications - No data to display  Initial Impression / Assessment and Plan / UC Course  I have reviewed the triage vital signs and the nursing notes.  Pertinent labs & imaging results that were available during my care of the patient were reviewed by me and considered in my medical decision making (see chart for details).     1.  Wide-complex tachycardia with a heart rate of 147: Patient will need higher level of care Patient is discharged from urgent care Patient will go to ED for further management Blood pressure was normal at the time of evaluation in the urgent care. Final Clinical Impressions(s) / UC Diagnoses   Final diagnoses:  None   Discharge Instructions   None    ED Prescriptions    None     Controlled Substance Prescriptions [place] Controlled Substance Registry consulted? No   Chase Picket, MD 03/04/19 [place]    Chase Picket, MD 03/04/19 [phone]

## 2019-03-04 NOTE — ED Triage Notes (Signed)
Patient is being discharged from the Urgent [facility] and sent to the Emergency Department. Per Dr. Lanny Cramp, patient is stable but in need of higher level of care due to elevated heart rate and abnormal EKG reading. Patient is aware and verbalizes understanding of plan of care.  Vitals:   03/04/19 1811  BP: 131/65  Pulse: (!) 147  Resp: 18  Temp: 98.3 F (36.8 C)  SpO2: 95%

## 2019-03-04 NOTE — Telephone Encounter (Signed)
Called pt, HR was still in the 140s BP = 136/100. After speaking with pt and Dr. Jonni Sanger she has been sent to [facility] and f/u appointment scheduled for tomorrow.

## 2019-03-05 ENCOUNTER — Other Ambulatory Visit (HOSPITAL_COMMUNITY): Payer: Medicare Other

## 2019-03-05 ENCOUNTER — Ambulatory Visit: Payer: Medicare Other | Admitting: Family Medicine

## 2019-03-05 ENCOUNTER — Encounter (HOSPITAL_COMMUNITY): Payer: Self-pay | Admitting: Internal Medicine

## 2019-03-05 DIAGNOSIS — I447 Left bundle-branch block, unspecified: Secondary | ICD-10-CM | POA: Diagnosis present

## 2019-03-05 DIAGNOSIS — I4892 Unspecified atrial flutter: Secondary | ICD-10-CM

## 2019-03-05 DIAGNOSIS — Z8261 Family history of arthritis: Secondary | ICD-10-CM | POA: Diagnosis not present

## 2019-03-05 DIAGNOSIS — G609 Hereditary and idiopathic neuropathy, unspecified: Secondary | ICD-10-CM | POA: Diagnosis present

## 2019-03-05 DIAGNOSIS — Z79899 Other long term (current) drug therapy: Secondary | ICD-10-CM | POA: Diagnosis not present

## 2019-03-05 DIAGNOSIS — G4700 Insomnia, unspecified: Secondary | ICD-10-CM | POA: Diagnosis not present

## 2019-03-05 DIAGNOSIS — Z833 Family history of diabetes mellitus: Secondary | ICD-10-CM | POA: Diagnosis not present

## 2019-03-05 DIAGNOSIS — I4430 Unspecified atrioventricular block: Secondary | ICD-10-CM

## 2019-03-05 DIAGNOSIS — N179 Acute kidney failure, unspecified: Secondary | ICD-10-CM | POA: Diagnosis not present

## 2019-03-05 DIAGNOSIS — M1710 Unilateral primary osteoarthritis, unspecified knee: Secondary | ICD-10-CM | POA: Diagnosis present

## 2019-03-05 DIAGNOSIS — E039 Hypothyroidism, unspecified: Secondary | ICD-10-CM

## 2019-03-05 DIAGNOSIS — N183 Chronic kidney disease, stage 3 (moderate): Secondary | ICD-10-CM | POA: Diagnosis present

## 2019-03-05 DIAGNOSIS — I471 Supraventricular tachycardia: Secondary | ICD-10-CM | POA: Diagnosis not present

## 2019-03-05 DIAGNOSIS — I351 Nonrheumatic aortic (valve) insufficiency: Secondary | ICD-10-CM | POA: Diagnosis not present

## 2019-03-05 DIAGNOSIS — Z79890 Hormone replacement therapy (postmenopausal): Secondary | ICD-10-CM | POA: Diagnosis not present

## 2019-03-05 DIAGNOSIS — I472 Ventricular tachycardia: Secondary | ICD-10-CM | POA: Diagnosis present

## 2019-03-05 DIAGNOSIS — E872 Acidosis: Secondary | ICD-10-CM | POA: Diagnosis not present

## 2019-03-05 DIAGNOSIS — I4891 Unspecified atrial fibrillation: Secondary | ICD-10-CM

## 2019-03-05 DIAGNOSIS — I088 Other rheumatic multiple valve diseases: Secondary | ICD-10-CM | POA: Diagnosis present

## 2019-03-05 DIAGNOSIS — I959 Hypotension, unspecified: Secondary | ICD-10-CM | POA: Diagnosis not present

## 2019-03-05 DIAGNOSIS — Z823 Family history of stroke: Secondary | ICD-10-CM | POA: Diagnosis not present

## 2019-03-05 DIAGNOSIS — I340 Nonrheumatic mitral (valve) insufficiency: Secondary | ICD-10-CM | POA: Diagnosis not present

## 2019-03-05 DIAGNOSIS — I4819 Other persistent atrial fibrillation: Secondary | ICD-10-CM | POA: Diagnosis not present

## 2019-03-05 DIAGNOSIS — Z1159 Encounter for screening for other viral diseases: Secondary | ICD-10-CM | POA: Diagnosis not present

## 2019-03-05 LAB — COMPREHENSIVE METABOLIC PANEL
ALT: 44 U/L (ref 0–44)
AST: 32 U/L (ref 15–41)
Albumin: 3.5 g/dL (ref 3.5–5.0)
Alkaline Phosphatase: 74 U/L (ref 38–126)
Anion gap: 10 (ref 5–15)
BUN: 15 mg/dL (ref 8–23)
CO2: 21 mmol/L — ABNORMAL LOW (ref 22–32)
Calcium: 8.7 mg/dL — ABNORMAL LOW (ref 8.9–10.3)
Chloride: 107 mmol/L (ref 98–111)
Creatinine, Ser: 1.22 mg/dL — ABNORMAL HIGH (ref 0.44–1.00)
GFR calc Af Amer: 47 mL/min — ABNORMAL LOW (ref >60)
GFR calc non Af Amer: 40 mL/min — ABNORMAL LOW (ref >60)
Glucose, Bld: 113 mg/dL — ABNORMAL HIGH (ref 70–99)
Potassium: 3.6 mmol/L (ref 3.5–5.1)
Sodium: 138 mmol/L (ref 135–145)
Total Bilirubin: 0.3 mg/dL (ref 0.3–1.2)
Total Protein: 5.9 g/dL — ABNORMAL LOW (ref 6.5–8.1)

## 2019-03-05 LAB — CBC WITH DIFFERENTIAL/PLATELET
Abs Immature Granulocytes: 0.03 10*3/uL (ref 0.00–0.07)
Basophils Absolute: 0.1 10*3/uL (ref 0.0–0.1)
Basophils Relative: 1 %
Eosinophils Absolute: 0.3 10*3/uL (ref 0.0–0.5)
Eosinophils Relative: 3 %
HCT: 38.7 % (ref 36.0–46.0)
Hemoglobin: 12.9 g/dL (ref 12.0–15.0)
Immature Granulocytes: 0 %
Lymphocytes Relative: 40 %
Lymphs Abs: 3.4 10*3/uL (ref 0.7–4.0)
MCH: 30.2 pg (ref 26.0–34.0)
MCHC: 33.3 g/dL (ref 30.0–36.0)
MCV: 90.6 fL (ref 80.0–100.0)
Monocytes Absolute: 0.7 10*3/uL (ref 0.1–1.0)
Monocytes Relative: 8 %
Neutro Abs: 4.2 10*3/uL (ref 1.7–7.7)
Neutrophils Relative %: 48 %
Platelets: 242 10*3/uL (ref 150–400)
RBC: 4.27 MIL/uL (ref 3.87–5.11)
RDW: 15.4 % (ref 11.5–15.5)
WBC: 8.7 10*3/uL (ref 4.0–10.5)
nRBC: 0 % (ref 0.0–0.2)

## 2019-03-05 LAB — HEPARIN LEVEL (UNFRACTIONATED): Heparin Unfractionated: 0.21 IU/mL — ABNORMAL LOW (ref 0.30–0.70)

## 2019-03-05 LAB — TROPONIN I (HIGH SENSITIVITY)
Troponin I (High Sensitivity): 34 ng/L — ABNORMAL HIGH (ref <18)
Troponin I (High Sensitivity): 37 ng/L — ABNORMAL HIGH (ref <18)

## 2019-03-05 MED ORDER — ACETAMINOPHEN 650 MG RE SUPP: 650.0000 mg | Freq: Four times a day (QID) | RECTAL | Status: DC | PRN

## 2019-03-05 MED ORDER — SODIUM CHLORIDE 0.9 % IV SOLN: INTRAVENOUS | Status: DC

## 2019-03-05 MED ORDER — SODIUM CHLORIDE 0.9% FLUSH: 3.0000 mL | INTRAVENOUS | Status: DC | PRN

## 2019-03-05 MED ORDER — LEVOTHYROXINE SODIUM 50 MCG PO TABS
50.0000 ug | ORAL_TABLET | ORAL | Status: DC
  Administered 2019-03-05 – 2019-03-08 (×4): 50 ug via ORAL
  Filled 2019-03-05 (×4): qty 1

## 2019-03-05 MED ORDER — LEVOTHYROXINE SODIUM 100 MCG PO TABS
100.0000 ug | ORAL_TABLET | ORAL | Status: DC
  Administered 2019-03-09: 100 ug via ORAL
  Filled 2019-03-05: qty 1

## 2019-03-05 MED ORDER — ACETAMINOPHEN 325 MG PO TABS
650.0000 mg | ORAL_TABLET | Freq: Four times a day (QID) | ORAL | Status: DC | PRN
  Administered 2019-03-06 – 2019-03-07 (×3): 650 mg via ORAL
  Filled 2019-03-05 (×3): qty 2

## 2019-03-05 MED ORDER — DILTIAZEM LOAD VIA INFUSION
10.0000 mg | Freq: Once | INTRAVENOUS | Status: AC
  Administered 2019-03-05: 10 mg via INTRAVENOUS
  Filled 2019-03-05: qty 10

## 2019-03-05 MED ORDER — MAGNESIUM OXIDE 400 (241.3 MG) MG PO TABS
400.0000 mg | ORAL_TABLET | Freq: Every day | ORAL | Status: DC
  Administered 2019-03-05 – 2019-03-08 (×5): 400 mg via ORAL
  Filled 2019-03-05 (×5): qty 1

## 2019-03-05 MED ORDER — ALPRAZOLAM 0.25 MG PO TABS
0.2500 mg | ORAL_TABLET | Freq: Once | ORAL | Status: AC
  Administered 2019-03-05: 0.25 mg via ORAL
  Filled 2019-03-05: qty 1

## 2019-03-05 MED ORDER — HEPARIN (PORCINE) 25000 UT/250ML-% IV SOLN
1050.0000 [IU]/h | INTRAVENOUS | Status: AC
  Administered 2019-03-05: 900 [IU]/h via INTRAVENOUS
  Filled 2019-03-05: qty 250

## 2019-03-05 MED ORDER — ONDANSETRON HCL 4 MG/2ML IJ SOLN: 4.0000 mg | Freq: Four times a day (QID) | INTRAMUSCULAR | Status: DC | PRN

## 2019-03-05 MED ORDER — WHITE PETROLATUM EX OINT
TOPICAL_OINTMENT | CUTANEOUS | Status: DC | PRN
  Filled 2019-03-05: qty 28.35

## 2019-03-05 MED ORDER — SODIUM CHLORIDE 0.9 % IV SOLN: 250.0000 mL | INTRAVENOUS | Status: DC

## 2019-03-05 MED ORDER — SODIUM CHLORIDE 0.9% FLUSH
3.0000 mL | Freq: Two times a day (BID) | INTRAVENOUS | Status: DC
  Administered 2019-03-05 – 2019-03-06 (×2): 3 mL via INTRAVENOUS

## 2019-03-05 MED ORDER — APIXABAN 5 MG PO TABS
5.0000 mg | ORAL_TABLET | Freq: Two times a day (BID) | ORAL | Status: DC
  Administered 2019-03-05 – 2019-03-09 (×8): 5 mg via ORAL
  Filled 2019-03-05 (×8): qty 1

## 2019-03-05 MED ORDER — ONDANSETRON HCL 4 MG PO TABS: 4.0000 mg | ORAL_TABLET | Freq: Four times a day (QID) | ORAL | Status: DC | PRN

## 2019-03-05 MED ORDER — LEVOTHYROXINE SODIUM 50 MCG PO TABS: 50.0000 ug | ORAL_TABLET | ORAL | Status: DC

## 2019-03-05 MED ORDER — LORAZEPAM 1 MG PO TABS
1.0000 mg | ORAL_TABLET | Freq: Every evening | ORAL | Status: DC | PRN
  Administered 2019-03-07 – 2019-03-08 (×3): 1 mg via ORAL
  Filled 2019-03-05 (×3): qty 1

## 2019-03-05 MED ORDER — DILTIAZEM HCL-DEXTROSE 100-5 MG/100ML-% IV SOLN (PREMIX)
5.0000 mg/h | INTRAVENOUS | Status: DC
  Administered 2019-03-05: 5 mg/h via INTRAVENOUS
  Administered 2019-03-05: 10 mg/h via INTRAVENOUS
  Administered 2019-03-07: 15 mg/h via INTRAVENOUS
  Administered 2019-03-07: 7.5 mg/h via INTRAVENOUS
  Administered 2019-03-08: 5 mg/h via INTRAVENOUS
  Filled 2019-03-05 (×7): qty 100

## 2019-03-05 NOTE — Progress Notes (Signed)
   Interim    85 year old Caucasian female [place] resident History of hypothyroidism occasional orthostasis, insomnia using minimal Ativan, knee osteoarthritis, migraines Admitted overnight early morning 7/17 4 days of cardia after being seen in urgent care with wide-complex tachycardia 140s-loaded with amiodarone in the ED Cardiology consulted Impression  Atrial tachycardia ?  DCCV, continue amiodarone, heparin IV at this time-await echo-cardiology to disposition Mild AKI, mild metabolic acidosis Both related to probable tachycardia and mild low-grade ischemia- Placed on saline 50 cc/8 for now Migraines   Subjective/interval events    Awake coherent no distress n.p.o. and ready for procedure if able to be done No pain Tells me no migraines has mild arthritis and is pretty healthy very vibrant active lady Objective     BP 112/81 (BP Location: Right Arm)   Pulse (!) 45   Temp 98 F (36.7 C) (Oral)   Resp 17   Ht 5' 6.5" (1.689 m)   Wt 78.9 kg   SpO2 95%   BMI 27.65 kg/m      Intake/Output Summary (Last 24 hours) at 03/05/2019 1124 Last data filed at 03/05/2019 0803 Gross per 24 hour  Intake 312.4 ml  Output 50 ml  Net 262.4 ml    GPE Awake coherent mild pallor no icterus Throat clear soft supple no thyromegaly no submandibular lymphadenopathy Chest clear Abdomen soft S1-S2 tachycardic rates in the 120s to 130s just sitting in the bed No lower extremity edema  Antibiotic/cultures   None  Glycemic control ordered: No SUP w/ no DVT prophylaxis w: On heparin Disposition   Code status: Full

## 2019-03-05 NOTE — Progress Notes (Signed)
ANTICOAGULATION CONSULT NOTE - Follow-Up Consult  Pharmacy Consult for Apixaban Indication: atrial fibrillation  No Known Allergies  Patient Measurements: Height: 5' 6.5" (168.9 cm) Weight: 173 lb 14.4 oz (78.9 kg) IBW/kg (Calculated) : 60.45  Vital Signs: Temp: 98 F (36.7 C) (07/17 0518) Temp Source: Oral (07/17 0518) BP: 112/81 (07/17 0518) Pulse Rate: 45 (07/17 0518)  Labs: Recent Labs    03/04/19 2106 03/05/19 0039 03/05/19 0214 03/05/19 0916  HGB 13.4 12.9  --   --   HCT 40.8 38.7  --   --   PLT 239 242  --   --   HEPARINUNFRC  --   --   --  0.21*  CREATININE 1.20* 1.22*  --   --   TROPONINIHS  --  34* 37*  --     Estimated Creatinine Clearance: 36.1 mL/min (A) (by C-G formula based on SCr of 1.22 mg/dL (H)).   Medical History: Past Medical History:  Diagnosis Date  . Migraines   . Primary osteoarthritis of knee   . Thyroid disease    Assessment: 85 year old female with atrial fibrillation on heparin to transition to Apixaban. She is currently on Heparin at 1050 units/hr. CBC is within normal limits. No bleeding has been reported. SCr is 1.22 with estimated CrCl ~36 mL/min. Current weight is 78.9 kg.   Goal of Therapy:  Heparin level 0.3-0.7 units/ml Monitor platelets by anticoagulation protocol: Yes   Plan:  Stop Heparin at the same time the first dose of Apixaban is given.  Discontinue Heparin levels.  Start Apixaban 5mg  po BID for wt >60kg and SCr <1.5.  Continue to monitor CBC and for signs and symptoms of bleeding.  Follow-up plans for TEE/DCCV  Sloan Leiter, PharmD, BCPS, BCCCP Clinical Pharmacist Clinical phone for 03/05/2019 from 3-10:30 is [phone].  **Pharmacist phone directory can now be found on [email or web address] ([email or web address]).  Listed under [place].  03/05/2019 2:48 PM

## 2019-03-05 NOTE — Progress Notes (Signed)
ANTICOAGULATION CONSULT NOTE - Follow-Up Consult  Pharmacy Consult for Heparin  Indication: ?Afib/flutter  No Known Allergies  Patient Measurements: Height: 5' 6.5" (168.9 cm) Weight: 173 lb 14.4 oz (78.9 kg) IBW/kg (Calculated) : 60.45  Vital Signs: Temp: 98 F (36.7 C) (07/17 0518) Temp Source: Oral (07/17 0518) BP: 112/81 (07/17 0518) Pulse Rate: 45 (07/17 0518)  Labs: Recent Labs    03/04/19 2106 03/05/19 0039 03/05/19 0214 03/05/19 0916  HGB 13.4 12.9  --   --   HCT 40.8 38.7  --   --   PLT 239 242  --   --   HEPARINUNFRC  --   --   --  0.21*  CREATININE 1.20* 1.22*  --   --   TROPONINIHS  --  34* 37*  --     Estimated Creatinine Clearance: 36.1 mL/min (A) (by C-G formula based on SCr of 1.22 mg/dL (H)).   Medical History: Past Medical History:  Diagnosis Date  . Migraines   . Primary osteoarthritis of knee   . Thyroid disease    Assessment: 85 y/o F with ?Afib/flutter continues on heparin.  Heparin level subtherapeutic on 900 units/hr.  No bleeding noted.  Goal of Therapy:  Heparin level 0.3-0.7 units/ml Monitor platelets by anticoagulation protocol: Yes   Plan:  Increase heparin drip to 1050 units/hr Recheck heparin level in 6 hours. Daily heparin level and CBC while on heparin Monitor for bleeding Follow-up plans for TEE/DCCV  [employer], Pharm.D., BCPS Clinical Pharmacist Clinical phone for 03/05/2019 from 8:30-4:00 is [phone].  **Pharmacist phone directory can now be found on [email or web address] ([email or web address]).  Listed under [place].  03/05/2019 11:34 AM

## 2019-03-05 NOTE — Progress Notes (Signed)
CRITICAL VALUE ALERT  Critical Value:  Troponin 37  Date & Time Notied:  7/17 at [place]  Provider Notified: MD on call  Orders Received/Actions taken: pending

## 2019-03-05 NOTE — Consult Note (Signed)
Cardiology Consultation:   Patient ID: Cathy Lambert MRN: [id number]; DOB: 1931/04/01  Admit date: 03/04/2019 Date of Consult: 03/05/2019  Primary Care Provider: Leamon Arnt, MD Primary Cardiologist: No primary care provider on file.  Primary Electrophysiologist:  None   History of Present Illness:   Cathy Lambert is an 85 year old woman with a history of hypothyroidism and migraines who presents to the ER after being referred from urgent care for tachycardia. The patient reported developing elevated heart rates 3-4 days ago. She had been checking her blood pressures at home and noted that her HR was reading high (~ 140s). She was otherwise asymptomatic without SOB, palpitations, or signs / symptoms of heart failure. She went to urgent care earlier today and was noted to be in a wide complex tachycardia (LBBB) in the 140s. She was subsequently sent to the ED.   Upon presentation, BP was 117/86 with HR 132. EKG regular, tachycardic with LBBB. Creatinine was 1.2 with bicarb 21. AST normal at 34 with ALT 45. TSH was 0.84 (though had been 2.33 4 months ago). Initial hsTn was 34. She was started on amiodarone in the ER with some improvement in her rates (likely from 2:1 aflutter to aflutter with variable block +/- afib).   Cardiology is consulted for management of new atrial arrhythmias.  On review of systems she denies chest pain / tightness, has not been lightheaded or dizzy, and not more short of breath. She does report feeling more tired over the last 2-3 days, which she attributes to the faster heart rates.   Past Medical History:  Diagnosis Date  . Migraines   . Primary osteoarthritis of knee   . Thyroid disease     Past Surgical History:  Procedure Laterality Date  . BUNIONECTOMY  1991  . CATARACT EXTRACTION  2008-2009  . TONSILLECTOMY AND ADENOIDECTOMY  1953     Inpatient Medications: Scheduled Meds: . levothyroxine  50 mcg Oral Once per day on Sun Mon Wed Fri Sat   And  .  [START ON 03/09/2019] levothyroxine  100 mcg Oral Once per day on Tue Thu  . magnesium oxide  400 mg Oral QHS   Continuous Infusions: . amiodarone 60 mg/hr (03/04/19 2313)  . amiodarone    . heparin     PRN Meds: acetaminophen **OR** acetaminophen, LORazepam, ondansetron **OR** ondansetron (ZOFRAN) IV  Allergies:   No Known Allergies  Social History:   Social History   Socioeconomic History  . Marital status: Divorced    Spouse name: Not on file  . Number of children: Not on file  . Years of education: Not on file  . Highest education level: Not on file  Occupational History  . Not on file  Social Needs  . Financial resource strain: Not on file  . Food insecurity    Worry: Not on file    Inability: Not on file  . Transportation needs    Medical: Not on file    Non-medical: Not on file  Tobacco Use  . Smoking status: Never Smoker  . Smokeless tobacco: Never Used  Substance and Sexual Activity  . Alcohol use: No    Frequency: Never  . Drug use: No  . Sexual activity: Never    Birth control/protection: Post-menopausal  Lifestyle  . Physical activity    Days per week: Not on file    Minutes per session: Not on file  . Stress: Not on file  Relationships  . Social connections  Talks on phone: Not on file    Gets together: Not on file    Attends religious service: Not on file    Active member of club or organization: Not on file    Attends meetings of clubs or organizations: Not on file    Relationship status: Not on file  . Intimate partner violence    Fear of current or ex partner: Not on file    Emotionally abused: Not on file    Physically abused: Not on file    Forced sexual activity: Not on file  Other Topics Concern  . Not on file  Social History Narrative  . Not on file    Family History:   Family History  Problem Relation Age of Onset  . Arthritis Mother   . Cancer Mother   . Cancer Sister   . Diabetes Brother   . Cancer Maternal Grandmother    . Stroke Maternal Grandfather   . Cancer Paternal Grandfather   . Hearing loss Sister   . Cancer Son      Review of Systems: [y] = yes, [ ]  = no     General: Weight gain [ ] ; Weight loss [ ] ; Anorexia [ ] ; Fatigue [y]; Fever [ ] ; Chills [ ] ; Weakness [ ]    Cardiac: Chest pain/pressure [ ] ; Resting SOB [ ] ; Exertional SOB [ ] ; Orthopnea [ ] ; Pedal Edema [ ] ; Palpitations [ ] ; Syncope [ ] ; Presyncope [ ] ; Paroxysmal nocturnal dyspnea[ ]    Pulmonary: Cough [ ] ; Wheezing[ ] ; Hemoptysis[ ] ; Sputum [ ] ; Snoring [ ]    GI: Vomiting[ ] ; Dysphagia[ ] ; Melena[ ] ; Hematochezia [ ] ; Heartburn[ ] ; Abdominal pain [ ] ; Constipation [ ] ; Diarrhea [ ] ; BRBPR [ ]    GU: Hematuria[ ] ; Dysuria [ ] ; Nocturia[ ]    Vascular: Pain in legs with walking [ ] ; Pain in feet with lying flat [ ] ; Non-healing sores [ ] ; Stroke [ ] ; TIA [ ] ; Slurred speech [ ] ;   Neuro: Headaches[ ] ; Vertigo[ ] ; Seizures[ ] ; Paresthesias[ ] ;Blurred vision [ ] ; Diplopia [ ] ; Vision changes [ ]    Ortho/Skin: Arthritis [ ] ; Joint pain [ ] ; Muscle pain [ ] ; Joint swelling [ ] ; Back Pain [ ] ; Rash [ ]    Psych: Depression[ ] ; Anxiety[ ]    Heme: Bleeding problems [ ] ; Clotting disorders [ ] ; Anemia [ ]    Endocrine: Diabetes [ ] ; Thyroid dysfunction[ ]   Physical Exam/Data:   Vitals:   03/04/19 2300 03/04/19 2328 03/04/19 2334 03/04/19 2335  BP: 117/86  (!) 138/106 (!) 141/111  Pulse: (!) 132  (!) 134 (!) 132  Resp: 16     Temp:   99.1 F (37.3 C) 99.1 F (37.3 C)  TempSrc:   Oral Oral  SpO2: 97%  98% 98%  Weight:  78.9 kg    Height:  5' 6.5" (1.689 m)      Intake/Output Summary (Last 24 hours) at 03/05/2019 0159 Last data filed at 03/05/2019 0021 Gross per 24 hour  Intake 120 ml  Output 50 ml  Net 70 ml   Filed Weights   03/04/19 2328  Weight: 78.9 kg   Body mass index is 27.65 kg/m.  General:  Well nourished, well developed, in no acute distress. Laying flat in bed.  HEENT: normal Lymph: no adenopathy Neck: no  JVD Endocrine:  No thryomegaly Vascular: No carotid bruits; FA pulses 2+ bilaterally without bruits  Cardiac:  Tachycardic, irregular. S1, S2; RRR; no murmur  Lungs:  clear to auscultation bilaterally, no wheezing, rhonchi or rales  Abd: soft, nontender, no hepatomegaly  Ext: no edema  Musculoskeletal:  No deformities, BUE and BLE strength normal and equal Skin: warm and dry  Neuro:  CNs 2-12 intact, no focal abnormalities noted Psych:  Normal affect   EKG:  The EKG was personally reviewed and demonstrates LBBB with HR 140s.  Telemetry:  Telemetry was personally reviewed and demonstrates atrial flutter with variable block rates 100s - 120s.   Relevant CV Studies: TTE pending  Laboratory Data:  Chemistry Recent Labs  Lab 03/04/19 2106  NA 140  K 4.0  CL 108  CO2 21*  GLUCOSE 105*  BUN 19  CREATININE 1.20*  CALCIUM 9.0  GFRNONAA 41*  GFRAA 48*  ANIONGAP 11    Recent Labs  Lab 03/04/19 2106  PROT 6.6  ALBUMIN 3.9  AST 34  ALT 45*  ALKPHOS 77  BILITOT 0.5   Hematology Recent Labs  Lab 03/04/19 2106 03/05/19 0039  WBC 8.3 8.7  RBC 4.44 4.27  HGB 13.4 12.9  HCT 40.8 38.7  MCV 91.9 90.6  MCH 30.2 30.2  MCHC 32.8 33.3  RDW 15.4 15.4  PLT 239 242   Cardiac EnzymesNo results for input(s): TROPONINI in the last 168 hours. No results for input(s): TROPIPOC in the last 168 hours.  BNPNo results for input(s): BNP, PROBNP in the last 168 hours.  DDimer No results for input(s): DDIMER in the last 168 hours.  Radiology/Studies:  Dg Chest Portable 1 View  Result Date: 03/04/2019 CLINICAL DATA:  Pt states she has had a high heart rate for 6 days. No other issues. Denies chest pain and SOB. No known heart or lung issues or surgeries. Pt is a nonsmoker. EXAM: PORTABLE CHEST 1 VIEW COMPARISON:  02/13/2011 FINDINGS: The heart size and mediastinal contours are within normal limits. Both lungs are clear. The visualized skeletal structures are unremarkable. IMPRESSION: No  active disease. Electronically Signed   By: Norva PavlovElizabeth  Brown M.D.   On: 03/04/2019 19:57    Assessment and Plan:   Cathy Lambert is an 85 year old woman with hypothyroidism who presents with new onset atrial flutter with variable block. Unclear precipitant at this time, and frankly no clear time course as it is possible that she has had pAF in the past given her lack of symptoms. She was started on amiodarone last night in the ED, but continues to have poor rate control particularly with any exertion. Will keep her NPO for this morning, plan to obtain a chest wall echo, and potentially plan for TEE and cardioversion later today.   -- Please order complete TTE in the AM -- Please make NPO at [facility] for possible TEE and DCCV -- Ok to continue amiodarone, but given thyroid dysfunction would prefer to use alternative agent for maintenance of sinus rhythm if cardioversion pursued.  -- Please check free T4/T3 -- Please send NT pro-BNP -- Agree with anticoagulation given CHAD-2-vasc == 3 for age and sex; heparin gtt started after [facility] Would plan to discharge on apixiban for anticoagulation.    Cardiology will continue to follow.   For questions or updates, please contact [facility] Please consult [email or web address] for contact info under    Signed, Laurell JosephsLauren K Luxe Cuadros, MD  03/05/2019 1:59 AM

## 2019-03-05 NOTE — Progress Notes (Addendum)
Progress Note  Patient Name: Cathy Lambert Date of Encounter: 03/05/2019  Primary Cardiologist: No primary care provider on file. new  Subjective   No chest pain and no SOB.  Still in a flutter with LBBB and at times narrow complex.    Inpatient Medications    Scheduled Meds: . levothyroxine  50 mcg Oral Once per day on Sun Mon Wed Fri Sat   And  . [START ON 03/09/2019] levothyroxine  100 mcg Oral Once per day on Tue Thu  . magnesium oxide  400 mg Oral QHS   Continuous Infusions: . amiodarone 30 mg/hr (03/05/19 0228)  . heparin 900 Units/hr (03/05/19 0226)   PRN Meds: acetaminophen **OR** acetaminophen, LORazepam, ondansetron **OR** ondansetron (ZOFRAN) IV   Vital Signs    Vitals:   03/04/19 2328 03/04/19 2334 03/04/19 2335 03/05/19 0518  BP:  (!) 138/106 (!) 141/111 112/81  Pulse:  (!) 134 (!) 132 (!) 45  Resp:    17  Temp:  99.1 F (37.3 C) 99.1 F (37.3 C) 98 F (36.7 C)  TempSrc:  Oral Oral Oral  SpO2:  98% 98% 95%  Weight: 78.9 kg     Height: 5' 6.5" (1.689 m)       Intake/Output Summary (Last 24 hours) at 03/05/2019 0742 Last data filed at 03/05/2019 0400 Gross per 24 hour  Intake 312.4 ml  Output 50 ml  Net 262.4 ml   Last 3 Weights 03/04/2019 10/21/2018 06/24/2018  Weight (lbs) 173 lb 14.4 oz 171 lb 6.4 oz 170 lb 9.6 oz  Weight (kg) 78.881 kg 77.747 kg 77.384 kg      Telemetry    A flutter with LBBB and at times narrow complex.  Rate 110 at rest to 120 with movement to 130s  - Personally Reviewed  ECG    Read as ST vs atypical a flutter no ST changes - Personally Reviewed  Physical Exam   GEN: No acute distress.   Neck: No JVD Cardiac: irreg irreg, no murmurs, rubs, or gallops.  Respiratory: Clear to auscultation bilaterally. GI: Soft, nontender, non-distended  MS: No edema; No deformity. Neuro:  Nonfocal  Psych: Normal affect   Labs    High Sensitivity Troponin:   Recent Labs  Lab 03/05/19 0039 03/05/19 0214  TROPONINIHS 34* 37*      Cardiac EnzymesNo results for input(s): TROPONINI in the last 168 hours. No results for input(s): TROPIPOC in the last 168 hours.   Chemistry Recent Labs  Lab 03/04/19 2106 03/05/19 0039  NA 140 138  K 4.0 3.6  CL 108 107  CO2 21* 21*  GLUCOSE 105* 113*  BUN 19 15  CREATININE 1.20* 1.22*  CALCIUM 9.0 8.7*  PROT 6.6 5.9*  ALBUMIN 3.9 3.5  AST 34 32  ALT 45* 44  ALKPHOS 77 74  BILITOT 0.5 0.3  GFRNONAA 41* 40*  GFRAA 48* 47*  ANIONGAP 11 10     Hematology Recent Labs  Lab 03/04/19 2106 03/05/19 0039  WBC 8.3 8.7  RBC 4.44 4.27  HGB 13.4 12.9  HCT 40.8 38.7  MCV 91.9 90.6  MCH 30.2 30.2  MCHC 32.8 33.3  RDW 15.4 15.4  PLT 239 242    BNPNo results for input(s): BNP, PROBNP in the last 168 hours.   DDimer No results for input(s): DDIMER in the last 168 hours.   Radiology    Dg Chest Portable 1 View  Result Date: 03/04/2019 CLINICAL DATA:  Pt states she has had  a high heart rate for 6 days. No other issues. Denies chest pain and SOB. No known heart or lung issues or surgeries. Pt is a nonsmoker. EXAM: PORTABLE CHEST 1 VIEW COMPARISON:  02/13/2011 FINDINGS: The heart size and mediastinal contours are within normal limits. Both lungs are clear. The visualized skeletal structures are unremarkable. IMPRESSION: No active disease. Electronically Signed   By: Nolon Nations M.D.   On: 03/04/2019 19:57    Cardiac Studies   Echo scheduled  Patient Profile     85 y.o. female with hypothyroidism and migraines admitted with atrial fib- asymptomatic and LBBB.  Placed on IV amiodarone in ER.    Assessment & Plan    New onset a fib with LBBB on amiodarone -today a flutter vs atrial tach?  NPO for possible DCCV --CHA2DS2VASc of 3, age and sex -eliquis on discharge currently on IV heparin --on amiodarone, TSH stable --asymptomatic   Elevated troponin flat at 34 and 37.  prob from a fib with RVR  Echo pending  CKD 3 with Cr 1.22   LBBB though at times narrow  complex beats.      For questions or updates, please contact [place] Please consult [email or web address] for contact info under        Signed, Cecilie Kicks, NP  03/05/2019, 7:42 AM     Attending interval note: Pt. Seen and examined. Agree with the Resident/NP/PA-C note as written.  Patient remains in a rapid atrial fibrillation but is asymptomatic.  Will plan for TEE cardioversion on Monday since no available slots today. Patient can eat.  CV: irregular rhythm, tachycardic. Gen: no acute distress, pleasant. Lungs: clear. Abd: soft nontender. MSK: no edema. Psych: A+O x 3  Continue IV amio, if rates remain elevated can initiate IV diltiazem. Will transition heparin to DOAC today since [place] is 3.  No echo needed until rates better controlled, now that we are planning TEE, transthoracic can be deferred to outpatient follow up unless otherwise indicated.   Time Spent with Patient: I have spent a total of 35 minutes with patient reviewing hospital notes, telemetry, EKGs, labs and examining the patient as well as establishing an assessment and plan that was discussed with the patient.  > 50% of time was spent in direct patient care.  Cherlynn Kaiser, MD [place]  Noninvasive Attending Cardiologist  [facility] Direct Dial: [phone] Website:  [email or web address]

## 2019-03-05 NOTE — Progress Notes (Signed)
ANTICOAGULATION CONSULT NOTE - Initial Consult  Pharmacy Consult for Heparin  Indication: ?Afib/flutter  No Known Allergies  Patient Measurements: Height: 5' 6.5" (168.9 cm) Weight: 173 lb 14.4 oz (78.9 kg) IBW/kg (Calculated) : 60.45  Vital Signs: Temp: 99.1 F (37.3 C) (07/16 2335) Temp Source: Oral (07/16 2335) BP: 141/111 (07/16 2335) Pulse Rate: 132 (07/16 2335)  Labs: Recent Labs    03/04/19 2106  HGB 13.4  HCT 40.8  PLT 239  CREATININE 1.20*    Estimated Creatinine Clearance: 36.7 mL/min (A) (by C-G formula based on SCr of 1.2 mg/dL (H)).   Medical History: Past Medical History:  Diagnosis Date  . Migraines   . Primary osteoarthritis of knee   . Thyroid disease    Assessment: 85 y/o F with ?Afib/flutter to begin heparin. No hx of afib/flutter and no anti-coagulation PTA. CBC good.   Goal of Therapy:  Heparin level 0.3-0.7 units/ml Monitor platelets by anticoagulation protocol: Yes   Plan:  -MD requests no bolus -Start heparin drip at 900 units/hr -0900 HL -Daily CBC/HL -Monitor for bleeding  Narda Bonds, PharmD, BCPS Clinical Pharmacist Phone: [phone]

## 2019-03-05 NOTE — Progress Notes (Addendum)
Pt's HR is faster, will add IV dilt to control. Will also switch to eliquis from Heparin.   Would consider adding BB in AM

## 2019-03-05 NOTE — Discharge Instructions (Signed)

## 2019-03-05 NOTE — H&P (Signed)
History and Physical    Cathy Banellen M [name]:[id number] DOB: 01-31-1931 DOA: 03/04/2019  PCP: Willow OraAndy, Camille L, MD  Patient coming from: Home.  Chief Complaint: Tachycardia.  HPI: Cathy Lambert is a 85 y.o. female with history of hypothyroidism presents to the ER because of persistent tachycardia.  Patient states she bought a new blood pressure measuring device and over the last 4 days noticed that her heart rate was in the 140s.  Patient trying to do yoga and meditation to decrease the heart rate but was not helpful.  Decided to come to the ER.  Patient has not had any dizziness palpitations chest pain shortness of breath or any diaphoresis.  ED Course: In the ER patient was found to be tachycardic with heart rate in the 140s with EKG showing wide complex tachycardia.  On-call cardiology was consulted and started on amiodarone drip.  At this time cardiology feel patient probably has atrial flutter.  Labs show creatinine 1.2 hemoglobin 13.4 TSH 0.8 chest x-ray unremarkable COVID-19 test negative.  Review of Systems: As per HPI, rest all negative.   Past Medical History:  Diagnosis Date  . Migraines   . Primary osteoarthritis of knee   . Thyroid disease     Past Surgical History:  Procedure Laterality Date  . BUNIONECTOMY  1991  . CATARACT EXTRACTION  2008-2009  . TONSILLECTOMY AND ADENOIDECTOMY  1953     reports that she has never smoked. She has never used smokeless tobacco. She reports that she does not drink alcohol or use drugs.  No Known Allergies  Family History  Problem Relation Age of Onset  . Arthritis Mother   . Cancer Mother   . Cancer Sister   . Diabetes Brother   . Cancer Maternal Grandmother   . Stroke Maternal Grandfather   . Cancer Paternal Grandfather   . Hearing loss Sister   . Cancer Son     Prior to Admission medications   Medication Sig Start Date End Date Taking? Authorizing Provider  B Complex Vitamins (VITAMIN B COMPLEX PO) Take 1 tablet by  mouth daily with breakfast.    Yes [provider]  Cholecalciferol (VITAMIN D-3 PO) Take 1 capsule by mouth daily with breakfast.   Yes [provider]  levothyroxine (SYNTHROID, LEVOTHROID) 50 MCG tablet Take 100mg  on Tu and Th, 50mg  on all other days Patient taking differently: Take 50-100 mcg by mouth See admin instructions. Take 50 mcg by mouth in the morning before breakfast on Sun/Mon/Wed/Fri/Sat and 100 mcg on Tues/Thurs 06/25/18  Yes Willow OraAndy, Camille L, MD  LORazepam (ATIVAN) 2 MG tablet TAKE 0.5 TABLETS (1 MG TOTAL) BY MOUTH AT BEDTIME AS NEEDED FOR SLEEP. 07/07/18 07/07/19 Yes Willow OraAndy, Camille L, MD  Magnesium 500 MG TABS Take 500 mg by mouth at bedtime.   Yes [provider]  Probiotic Product (PROBIOTIC DAILY PO) Take 1 capsule by mouth daily.    Yes [provider]    Physical Exam: Constitutional: Moderately built and nourished. Vitals:   03/04/19 2300 03/04/19 2328 03/04/19 2334 03/04/19 2335  BP: 117/86  (!) 138/106 (!) 141/111  Pulse: (!) 132  (!) 134 (!) 132  Resp: 16     Temp:   99.1 F (37.3 C) 99.1 F (37.3 C)  TempSrc:   Oral Oral  SpO2: 97%  98% 98%  Weight:  78.9 kg    Height:  5' 6.5" (1.689 m)     Eyes: Anicteric no pallor. ENMT: No discharge  from the ears eyes nose or mouth. Neck: No mass felt.  No neck rigidity. Respiratory: No rhonchi or crepitations. Cardiovascular: S1-S2 heard. Abdomen: Soft nontender bowel sounds present. Musculoskeletal: No edema. Skin: No rash. Neurologic: Alert awake oriented to time place and person.  Moves all extremities. Psychiatric: Normal.  Normal affect.   Labs on Admission: I have personally reviewed following labs and imaging studies  CBC: Recent Labs  Lab 03/04/19 2106  WBC 8.3  HGB 13.4  HCT 40.8  MCV 91.9  PLT 239   Basic Metabolic Panel: Recent Labs  Lab 03/04/19 2106  NA 140  K 4.0  CL 108  CO2 21*  GLUCOSE 105*  BUN 19  CREATININE 1.20*  CALCIUM 9.0  MG 2.3   PHOS 3.7   GFR: Estimated Creatinine Clearance: 36.7 mL/min (A) (by C-G formula based on SCr of 1.2 mg/dL (H)). Liver Function Tests: Recent Labs  Lab 03/04/19 2106  AST 34  ALT 45*  ALKPHOS 77  BILITOT 0.5  PROT 6.6  ALBUMIN 3.9   No results for input(s): LIPASE, AMYLASE in the last 168 hours. No results for input(s): AMMONIA in the last 168 hours. Coagulation Profile: No results for input(s): INR, PROTIME in the last 168 hours. Cardiac Enzymes: No results for input(s): CKTOTAL, CKMB, CKMBINDEX, TROPONINI in the last 168 hours. BNP (last 3 results) No results for input(s): PROBNP in the last 8760 hours. HbA1C: No results for input(s): HGBA1C in the last 72 hours. CBG: No results for input(s): GLUCAP in the last 168 hours. Lipid Profile: No results for input(s): CHOL, HDL, LDLCALC, TRIG, CHOLHDL, LDLDIRECT in the last 72 hours. Thyroid Function Tests: Recent Labs    03/04/19 2107  TSH 0.840   Anemia Panel: No results for input(s): VITAMINB12, FOLATE, FERRITIN, TIBC, IRON, RETICCTPCT in the last 72 hours. Urine analysis: No results found for: COLORURINE, APPEARANCEUR, LABSPEC, PHURINE, GLUCOSEU, HGBUR, BILIRUBINUR, KETONESUR, PROTEINUR, UROBILINOGEN, NITRITE, LEUKOCYTESUR Sepsis Labs: @LABRCNTIP (procalcitonin:4,lacticidven:4) ) Recent Results (from the past 240 hour(s))  SARS Coronavirus 2 (CEPHEID - Performed [facility]), Hosp Order     Status: None   Collection Time: 03/04/19  9:16 PM   Specimen: Nasopharyngeal Swab  Result Value Ref Range Status   SARS Coronavirus 2 NEGATIVE NEGATIVE Final    Comment: (NOTE) If result is NEGATIVE SARS-CoV-2 target nucleic acids are NOT DETECTED. The SARS-CoV-2 RNA is generally detectable in upper and lower  respiratory specimens during the acute phase of infection. The lowest  concentration of SARS-CoV-2 viral copies this assay can detect is 250  copies / mL. A negative result does not preclude SARS-CoV-2  infection  and should not be used as the sole basis for treatment or other  patient management decisions.  A negative result may occur with  improper specimen collection / handling, submission of specimen other  than nasopharyngeal swab, presence of viral mutation(s) within the  areas targeted by this assay, and inadequate number of viral copies  (<250 copies / mL). A negative result must be combined with clinical  observations, patient history, and epidemiological information. If result is POSITIVE SARS-CoV-2 target nucleic acids are DETECTED. The SARS-CoV-2 RNA is generally detectable in upper and lower  respiratory specimens dur ing the acute phase of infection.  Positive  results are indicative of active infection with SARS-CoV-2.  Clinical  correlation with patient history and other diagnostic information is  necessary to determine patient infection status.  Positive results do  not rule out bacterial infection or  co-infection with other viruses. If result is PRESUMPTIVE POSTIVE SARS-CoV-2 nucleic acids MAY BE PRESENT.   A presumptive positive result was obtained on the submitted specimen  and confirmed on repeat testing.  While 2019 novel coronavirus  (SARS-CoV-2) nucleic acids may be present in the submitted sample  additional confirmatory testing may be necessary for epidemiological  and / or clinical management purposes  to differentiate between  SARS-CoV-2 and other Sarbecovirus currently known to infect humans.  If clinically indicated additional testing with an alternate test  methodology [phone]) is advised. The SARS-CoV-2 RNA is generally  detectable in upper and lower respiratory sp ecimens during the acute  phase of infection. The expected result is Negative. Fact Sheet for Patients:  StrictlyIdeas.no Fact Sheet for Healthcare Providers: BankingDealers.co.za This test is not yet approved or cleared by the [place]  FDA and has been authorized for detection and/or diagnosis of SARS-CoV-2 by FDA under an Emergency Use Authorization (EUA).  This EUA will remain in effect (meaning this test can be used) for the duration of the COVID-19 declaration under Section 564(b)(1) of the Act, 21 U.S.C. section 360bbb-3(b)(1), unless the authorization is terminated or revoked sooner. Performed at [facility], [place] [address], [place], [place] [address]      Radiological Exams on Admission: Dg Chest Portable 1 View  Result Date: 03/04/2019 CLINICAL DATA:  Pt states she has had a high heart rate for 6 days. No other issues. Denies chest pain and SOB. No known heart or lung issues or surgeries. Pt is a nonsmoker. EXAM: PORTABLE CHEST 1 VIEW COMPARISON:  02/13/2011 FINDINGS: The heart size and mediastinal contours are within normal limits. Both lungs are clear. The visualized skeletal structures are unremarkable. IMPRESSION: No active disease. Electronically Signed   By: Nolon Nations M.D.   On: 03/04/2019 19:57    EKG: Independently reviewed.  Wide-complex tachycardia.  Assessment/Plan Principal Problem:   Wide-complex tachycardia (HCC) Active Problems:   Acquired hypothyroidism   Primary osteoarthritis of knee    1. Wide-complex tachycardia likely atrial flutter -discussed with on-call cardiologist.  At this time given that patient likely has atrial flutter with a chads 2 vasc score of at least 3 patient has been started on heparin.  Continue amiodarone for now cardiology is planning for cardioversion.  Check 2D echo cardiac markers. 2. Hypothyroidism on Synthroid. 3. Probably chronic kidney disease stage II -follow metabolic panel.  I have reviewed patient's old chart labs and discussed with on-call cardiologist.   DVT prophylaxis: Heparin. Code Status: Full code. Family Communication: Discussed with patient. Disposition Plan: Home. Consults called: Cardiologist. Admission status: Observation    Rise Patience MD [facility] Pager [phone].  If 7PM-7AM, please contact night-coverage [email or web address] Password [email or web address]  03/05/2019, 12:19 AM

## 2019-03-06 DIAGNOSIS — I472 Ventricular tachycardia: Secondary | ICD-10-CM

## 2019-03-06 LAB — CBC
HCT: 39.1 % (ref 36.0–46.0)
Hemoglobin: 13.1 g/dL (ref 12.0–15.0)
MCH: 30 pg (ref 26.0–34.0)
MCHC: 33.5 g/dL (ref 30.0–36.0)
MCV: 89.5 fL (ref 80.0–100.0)
Platelets: 242 10*3/uL (ref 150–400)
RBC: 4.37 MIL/uL (ref 3.87–5.11)
RDW: 15.2 % (ref 11.5–15.5)
WBC: 8.3 10*3/uL (ref 4.0–10.5)
nRBC: 0 % (ref 0.0–0.2)

## 2019-03-06 LAB — RENAL FUNCTION PANEL
Albumin: 3.7 g/dL (ref 3.5–5.0)
Anion gap: 10 (ref 5–15)
BUN: 11 mg/dL (ref 8–23)
CO2: 22 mmol/L (ref 22–32)
Calcium: 9.1 mg/dL (ref 8.9–10.3)
Chloride: 106 mmol/L (ref 98–111)
Creatinine, Ser: 1.15 mg/dL — ABNORMAL HIGH (ref 0.44–1.00)
GFR calc Af Amer: 50 mL/min — ABNORMAL LOW (ref >60)
GFR calc non Af Amer: 43 mL/min — ABNORMAL LOW (ref >60)
Glucose, Bld: 124 mg/dL — ABNORMAL HIGH (ref 70–99)
Phosphorus: 3 mg/dL (ref 2.5–4.6)
Potassium: 3.9 mmol/L (ref 3.5–5.1)
Sodium: 138 mmol/L (ref 135–145)

## 2019-03-06 LAB — MAGNESIUM: Magnesium: 2.2 mg/dL (ref 1.7–2.4)

## 2019-03-06 MED ORDER — DIPHENHYDRAMINE HCL 12.5 MG/5ML PO ELIX
12.5000 mg | ORAL_SOLUTION | Freq: Once | ORAL | Status: AC
  Administered 2019-03-06: 12.5 mg via ORAL
  Filled 2019-03-06: qty 10

## 2019-03-06 MED ORDER — SODIUM CHLORIDE 0.9 % IV SOLN
INTRAVENOUS | Status: DC
  Administered 2019-03-07: 23:00:00 via INTRAVENOUS

## 2019-03-06 NOTE — Progress Notes (Signed)
   Interim    85 year old Caucasian female retirement community resident History of hypothyroidism occasional orthostasis, insomnia using minimal Ativan, knee osteoarthritis, migraines Admitted overnight early morning 7/17 4 days of cardia after being seen in urgent care with wide-complex tachycardia 140s-loaded with amiodarone in the ED Cardiology consulted Looks like DCCV 7/20? Impression  Atrial tachycardia ?  DCCV, continue amiodarone, heparin IV at this time-await echo-cardiology to disposition Mild AKI, mild metabolic acidosis Improving-saline lockedam labs   Subjective/interval events    No c/o no cp Expresses some worry and asking many q's about procedure  Objective     BP (!) 117/91 (BP Location: Left Wrist)   Pulse (!) 121   Temp 99.3 F (37.4 C) (Oral) Comment: pt was eating Comment (Src): allixary wouldnt work'  Resp 20   Ht 5' 6.5" (1.689 m)   Wt 77.3 kg   SpO2 98%   BMI 27.09 kg/m      Intake/Output Summary (Last 24 hours) at 03/06/2019 1030 Last data filed at 03/06/2019 0600 Gross per 24 hour  Intake 1414.91 ml  Output 1000 ml  Net 414.91 ml    GPE No change to exam 7/18  Awake coherent mild pallor no icterus Throat clear soft supple no thyromegaly no submandibular lymphadenopathy Chest clear Abdomen soft S1-S2 tachycardic rates in the 120s to 130s just sitting in the bed No lower extremity edema  Antibiotic/cultures   None  Glycemic control ordered: No SUP w/ no DVT prophylaxis w: On heparin Disposition   Code status: Full

## 2019-03-06 NOTE — Progress Notes (Signed)
Called to patient room at 1800.  Tubing for IV infusion of diltiazem and amiodarone had become disconnected from patient.  This was infusing through a peripheral site in the right forearm.  Site is red and swollen, with a noticeable ridge extending nearly to [facility] area.  There is a small area at the right [facility] where a PIV was removed earlier due to occlusion that is red and itching as well.  No other symptoms (breathing, temperature) suggestive of allergic reaction.  Dr. Verlon Au notified, order received for benadryl.  Dr. Verlon Au asked that cardiology assess the sites, and I texted them with this request.  Pharmacy also contacted, who confirmed plan of elevating right arm and applying heat.  They also agree with plan to start two new IV sites and run the cardizem to one while running the amiodarone to the other, in case an allergy is indeed the culprit.  Both infusions off as of 1800, and will restart once IV team and SWAT RN have started new IV sites.

## 2019-03-06 NOTE — Progress Notes (Signed)
Dr Delphina Cahill rounding note reviewed. New onset afib with with plans for TEE cardioversion on Monday. Has been on IV amio, started on anticoagulation. Rates remained elevated, started on IV dilt yesterday. Rates 90s-110s, at times higher. Room to titrate dilt drip if needed, only on 5mg /hr. BP's are reasonable, continue current therapy today, plan for TEE/DCCV on Monday.    Zandra Abts MD

## 2019-03-07 LAB — CBC WITH DIFFERENTIAL/PLATELET
Abs Immature Granulocytes: 0.02 10*3/uL (ref 0.00–0.07)
Basophils Absolute: 0.1 10*3/uL (ref 0.0–0.1)
Basophils Relative: 1 %
Eosinophils Absolute: 0.3 10*3/uL (ref 0.0–0.5)
Eosinophils Relative: 4 %
HCT: 40.5 % (ref 36.0–46.0)
Hemoglobin: 13.5 g/dL (ref 12.0–15.0)
Immature Granulocytes: 0 %
Lymphocytes Relative: 33 %
Lymphs Abs: 2.5 10*3/uL (ref 0.7–4.0)
MCH: 29.8 pg (ref 26.0–34.0)
MCHC: 33.3 g/dL (ref 30.0–36.0)
MCV: 89.4 fL (ref 80.0–100.0)
Monocytes Absolute: 0.8 10*3/uL (ref 0.1–1.0)
Monocytes Relative: 11 %
Neutro Abs: 4 10*3/uL (ref 1.7–7.7)
Neutrophils Relative %: 51 %
Platelets: 245 10*3/uL (ref 150–400)
RBC: 4.53 MIL/uL (ref 3.87–5.11)
RDW: 15.2 % (ref 11.5–15.5)
WBC: 7.8 10*3/uL (ref 4.0–10.5)
nRBC: 0 % (ref 0.0–0.2)

## 2019-03-07 LAB — RENAL FUNCTION PANEL
Albumin: 3.4 g/dL — ABNORMAL LOW (ref 3.5–5.0)
Anion gap: 9 (ref 5–15)
BUN: 7 mg/dL — ABNORMAL LOW (ref 8–23)
CO2: 22 mmol/L (ref 22–32)
Calcium: 9 mg/dL (ref 8.9–10.3)
Chloride: 109 mmol/L (ref 98–111)
Creatinine, Ser: 0.96 mg/dL (ref 0.44–1.00)
GFR calc Af Amer: >60 mL/min (ref >60)
GFR calc non Af Amer: 54 mL/min — ABNORMAL LOW (ref >60)
Glucose, Bld: 112 mg/dL — ABNORMAL HIGH (ref 70–99)
Phosphorus: 3.2 mg/dL (ref 2.5–4.6)
Potassium: 3.7 mmol/L (ref 3.5–5.1)
Sodium: 140 mmol/L (ref 135–145)

## 2019-03-07 MED ORDER — DOCUSATE SODIUM 100 MG PO CAPS: 100.0000 mg | ORAL_CAPSULE | Freq: Two times a day (BID) | ORAL | Status: DC | PRN

## 2019-03-07 MED ORDER — DOCUSATE SODIUM 100 MG PO CAPS
100.0000 mg | ORAL_CAPSULE | Freq: Two times a day (BID) | ORAL | Status: DC | PRN
  Administered 2019-03-07: 100 mg via ORAL
  Filled 2019-03-07: qty 1

## 2019-03-07 NOTE — Progress Notes (Signed)
New onset afib with plans for TEE cardioversion Monday. Has been on IV amion, IV dilt. Tele reviewed, rates variable but reasonable at this time. Room to titrate dilt IV as needed. Plan for TEE/DCCV tomorrow.    Zandra Abts MD

## 2019-03-07 NOTE — Progress Notes (Signed)
Dr Aram Beecham came up to see patient and to examine her infiltrate in her arm, it's elevated on a pillow with a hot pack on it and patient states "It feels much better", reviewed VS especially since she was on a Cardizem and Amiodarone drip and it was abruptly stopped til new IV's were placed, will need to titrate Cardizem drip up but patient does have some anxiety about her future TEE/Cardioversion to be done Monday AM, reviewed it with her and answered her questions which made her more comfortable but she's still worried about her IV sites, reassured her that I would watch them closely.

## 2019-03-07 NOTE — H&P (View-Only) (Signed)
  Interim    85-year-old Caucasian female retirement community resident History of hypothyroidism occasional orthostasis, insomnia using minimal Ativan, knee osteoarthritis, migraines Admitted overnight early morning 7/17 4 days of cardia after being seen in urgent care with wide-complex tachycardia 140s-loaded with amiodarone in the ED Cardiology consulted developing some hypotension 2/2 treatement of Flutter likely iatrogenic 2/2 Cardizem Looks like DCCV 7/20? Impression  Atrial tachycardia ?  DCCV, continue amiodarone--tirtrate Cardizem if BP allows--HR still ~ 120, heparin IV at this time-await echo-cardiology to disposition Mild AKI, mild metabolic acidosis resolved   Subjective/interval events    No c/o no cp Wants to get up-no fever no dizzy eating  Objective     BP 108/78   Pulse 65   Temp 98.1 F (36.7 C) (Oral)   Resp 20   Ht 5' 6.5" (1.689 m)   Wt 77.7 kg   SpO2 94%   BMI 27.25 kg/m      Intake/Output Summary (Last 24 hours) at 03/07/2019 1333 Last data filed at 03/07/2019 0900 Gross per 24 hour  Intake 1263.35 ml  Output 775 ml  Net 488.35 ml    GPE  Awake coherent mild pallor no icterus Chest clear Abdomen soft S1-S2 tachycardic rates in the 120s to 130s just sitting in the bed No lower extremity edema  Antibiotic/cultures   None  Glycemic control ordered: No SUP w/ no DVT prophylaxis w: On heparin Disposition   Code status: Full     

## 2019-03-07 NOTE — Progress Notes (Signed)
  Interim    85 year old Caucasian female retirement community resident History of hypothyroidism occasional orthostasis, insomnia using minimal Ativan, knee osteoarthritis, migraines Admitted overnight early morning 7/17 4 days of cardia after being seen in urgent care with wide-complex tachycardia 140s-loaded with amiodarone in the ED Cardiology consulted developing some hypotension 2/2 treatement of Flutter likely iatrogenic 2/2 Cardizem Looks like DCCV 7/20? Impression  Atrial tachycardia ?  DCCV, continue amiodarone--tirtrate Cardizem if BP allows--HR still ~ 120, heparin IV at this time-await echo-cardiology to disposition Mild AKI, mild metabolic acidosis resolved   Subjective/interval events    No c/o no cp Wants to get up-no fever no dizzy eating  Objective     BP 108/78   Pulse 65   Temp 98.1 F (36.7 C) (Oral)   Resp 20   Ht 5' 6.5" (1.689 m)   Wt 77.7 kg   SpO2 94%   BMI 27.25 kg/m      Intake/Output Summary (Last 24 hours) at 03/07/2019 1333 Last data filed at 03/07/2019 0900 Gross per 24 hour  Intake 1263.35 ml  Output 775 ml  Net 488.35 ml    GPE  Awake coherent mild pallor no icterus Chest clear Abdomen soft S1-S2 tachycardic rates in the 120s to 130s just sitting in the bed No lower extremity edema  Antibiotic/cultures   None  Glycemic control ordered: No SUP w/ no DVT prophylaxis w: On heparin Disposition   Code status: Full

## 2019-03-07 NOTE — Anesthesia Preprocedure Evaluation (Addendum)
Anesthesia Evaluation  Patient identified by MRN, date of birth, ID band Patient awake    Reviewed: Allergy & Precautions, NPO status , Patient's Chart, lab work & pertinent test results  Airway Mallampati: II  TM Distance: >3 FB Neck ROM: Full    Dental no notable dental hx.    Pulmonary neg pulmonary ROS,    Pulmonary exam normal breath sounds clear to auscultation       Cardiovascular + dysrhythmias Atrial Fibrillation  Rhythm:Regular Rate:Tachycardia  ECG: a-fib with RVR, rate 119   Neuro/Psych  Headaches, negative psych ROS   GI/Hepatic negative GI ROS, Neg liver ROS,   Endo/Other  negative endocrine ROS  Renal/GU negative Renal ROS     Musculoskeletal negative musculoskeletal ROS (+)   Abdominal   Peds  Hematology negative hematology ROS (+)   Anesthesia Other Findings A-FIB  Reproductive/Obstetrics                            Anesthesia Physical Anesthesia Plan  ASA: IV  Anesthesia Plan: MAC   Post-op Pain Management:    Induction: Intravenous  PONV Risk Score and Plan: 2 and Propofol infusion and Treatment may vary due to age or medical condition  Airway Management Planned: Nasal Cannula  Additional Equipment:   Intra-op Plan:   Post-operative Plan:   Informed Consent: I have reviewed the patients History and Physical, chart, labs and discussed the procedure including the risks, benefits and alternatives for the proposed anesthesia with the patient or authorized representative who has indicated his/her understanding and acceptance.     Dental advisory given  Plan Discussed with: CRNA  Anesthesia Plan Comments:        Anesthesia Quick Evaluation

## 2019-03-07 NOTE — Progress Notes (Signed)
Dr Aram Beecham was up on the unit and made aware of patient's HR and B/P throughout the night, but patient remains A/Ox4 with skin warm and dry and no S/Sof complications, will continue to monitor. Cardizem drip now at 5mg /hr.

## 2019-03-08 ENCOUNTER — Inpatient Hospital Stay (HOSPITAL_COMMUNITY): Payer: Medicare Other | Admitting: Anesthesiology

## 2019-03-08 ENCOUNTER — Encounter (HOSPITAL_COMMUNITY)
Admission: EM | Disposition: A | Discharge status: Home / Self Care | Payer: Self-pay | Source: Home / Self Care | Attending: Family Medicine

## 2019-03-08 ENCOUNTER — Encounter (HOSPITAL_COMMUNITY): Payer: Self-pay | Admitting: *Deleted

## 2019-03-08 ENCOUNTER — Ambulatory Visit: Payer: Medicare Other | Admitting: Family Medicine

## 2019-03-08 ENCOUNTER — Inpatient Hospital Stay (HOSPITAL_COMMUNITY): Payer: Medicare Other

## 2019-03-08 DIAGNOSIS — I4819 Other persistent atrial fibrillation: Secondary | ICD-10-CM

## 2019-03-08 DIAGNOSIS — I447 Left bundle-branch block, unspecified: Secondary | ICD-10-CM

## 2019-03-08 DIAGNOSIS — I351 Nonrheumatic aortic (valve) insufficiency: Secondary | ICD-10-CM

## 2019-03-08 DIAGNOSIS — I340 Nonrheumatic mitral (valve) insufficiency: Secondary | ICD-10-CM

## 2019-03-08 HISTORY — PX: TEE WITHOUT CARDIOVERSION: SHX5443

## 2019-03-08 HISTORY — PX: CARDIOVERSION: SHX1299

## 2019-03-08 LAB — RENAL FUNCTION PANEL
Albumin: 3.4 g/dL — ABNORMAL LOW (ref 3.5–5.0)
Anion gap: 10 (ref 5–15)
BUN: 12 mg/dL (ref 8–23)
CO2: 22 mmol/L (ref 22–32)
Calcium: 9 mg/dL (ref 8.9–10.3)
Chloride: 108 mmol/L (ref 98–111)
Creatinine, Ser: 1.08 mg/dL — ABNORMAL HIGH (ref 0.44–1.00)
GFR calc Af Amer: 54 mL/min — ABNORMAL LOW (ref >60)
GFR calc non Af Amer: 47 mL/min — ABNORMAL LOW (ref >60)
Glucose, Bld: 108 mg/dL — ABNORMAL HIGH (ref 70–99)
Phosphorus: 4.1 mg/dL (ref 2.5–4.6)
Potassium: 4 mmol/L (ref 3.5–5.1)
Sodium: 140 mmol/L (ref 135–145)

## 2019-03-08 LAB — PROTIME-INR
INR: 1.4 — ABNORMAL HIGH (ref 0.8–1.2)
Prothrombin Time: 16.7 seconds — ABNORMAL HIGH (ref 11.4–15.2)

## 2019-03-08 LAB — MAGNESIUM: Magnesium: 2.3 mg/dL (ref 1.7–2.4)

## 2019-03-08 SURGERY — ECHOCARDIOGRAM, TRANSESOPHAGEAL
Anesthesia: Monitor Anesthesia Care

## 2019-03-08 MED ORDER — LIDOCAINE HCL (CARDIAC) PF 100 MG/5ML IV SOSY
PREFILLED_SYRINGE | INTRAVENOUS | Status: DC | PRN
  Administered 2019-03-08: 50 mg via INTRAVENOUS

## 2019-03-08 MED ORDER — PHENYLEPHRINE 40 MCG/ML (10ML) SYRINGE FOR IV PUSH (FOR BLOOD PRESSURE SUPPORT)
PREFILLED_SYRINGE | INTRAVENOUS | Status: DC | PRN
  Administered 2019-03-08 (×3): 60 ug via INTRAVENOUS

## 2019-03-08 MED ORDER — PROPOFOL 10 MG/ML IV BOLUS
INTRAVENOUS | Status: DC | PRN
  Administered 2019-03-08: 10 mg via INTRAVENOUS
  Administered 2019-03-08: 20 mg via INTRAVENOUS
  Administered 2019-03-08 (×2): 10 mg via INTRAVENOUS

## 2019-03-08 MED ORDER — METOPROLOL TARTRATE 25 MG PO TABS
25.0000 mg | ORAL_TABLET | Freq: Two times a day (BID) | ORAL | Status: DC
  Administered 2019-03-08 – 2019-03-09 (×3): 25 mg via ORAL
  Filled 2019-03-08 (×3): qty 1

## 2019-03-08 MED ORDER — PROPOFOL 500 MG/50ML IV EMUL
INTRAVENOUS | Status: DC | PRN
  Administered 2019-03-08: 50 ug/kg/min via INTRAVENOUS

## 2019-03-08 MED ORDER — AMIODARONE HCL 200 MG PO TABS
200.0000 mg | ORAL_TABLET | Freq: Every day | ORAL | Status: DC
  Administered 2019-03-08 – 2019-03-09 (×2): 200 mg via ORAL
  Filled 2019-03-08 (×2): qty 1

## 2019-03-08 NOTE — Progress Notes (Addendum)
Progress Note  Patient Name: Cathy Lambert Date of Encounter: 03/08/2019  Primary Cardiologist: Parke PoissonGayatri A Acharya, MD   Subjective   Feeling well this morning. No complaints of chest pain or SOB. Still unaware of fast HR.   Inpatient Medications    Scheduled Meds: . [MAR Hold] apixaban  5 mg Oral BID  . [MAR Hold] levothyroxine  50 mcg Oral Once per day on Sun Mon Wed Fri Sat   And  . [MAR Hold] levothyroxine  100 mcg Oral Once per day on Tue Thu  . [MAR Hold] magnesium oxide  400 mg Oral QHS   Continuous Infusions: . sodium chloride 20 mL/hr at 03/08/19 0553  . amiodarone 30 mg/hr (03/08/19 0553)  . diltiazem (CARDIZEM) infusion 5 mg/hr (03/08/19 0603)   PRN Meds: [MAR Hold] acetaminophen **OR** [MAR Hold] acetaminophen, [MAR Hold] docusate sodium, [MAR Hold] LORazepam, [MAR Hold] ondansetron **OR** [MAR Hold] ondansetron (ZOFRAN) IV, [MAR Hold] white petrolatum   Vital Signs    Vitals:   03/08/19 0217 03/08/19 0247 03/08/19 0455 03/08/19 0704  BP: 94/69 91/68 103/82 (!) 142/86  Pulse:   (!) 119   Resp:    (!) 21  Temp:   98.3 F (36.8 C) 98.4 F (36.9 C)  TempSrc:   Oral Temporal  SpO2:   97% 97%  Weight:   78.4 kg 78.4 kg  Height:    5' 6.5" (1.689 m)    Intake/Output Summary (Last 24 hours) at 03/08/2019 0739 Last data filed at 03/08/2019 0553 Gross per 24 hour  Intake 1674.68 ml  Output 1325 ml  Net 349.68 ml   Filed Weights   03/07/19 0437 03/08/19 0455 03/08/19 0704  Weight: 77.7 kg 78.4 kg 78.4 kg    Telemetry    Seen in endoscopy - unable to review    Physical Exam   GEN: Sitting upright in bed in no acute distress.   Neck: No JVD, no carotid bruits Cardiac: IRIR, no murmurs, rubs, or gallops.  Respiratory: Clear to auscultation bilaterally, no wheezes/ rales/ rhonchi GI: NABS, Soft, nontender, non-distended  MS: No edema; No deformity. Neuro:  Nonfocal, moving all extremities spontaneously Psych: Normal affect   Labs    Chemistry  Recent Labs  Lab 03/04/19 2106 03/05/19 0039 03/06/19 0809 03/07/19 0731 03/08/19 0406  NA 140 138 138 140 140  K 4.0 3.6 3.9 3.7 4.0  CL 108 107 106 109 108  CO2 21* 21* 22 22 22   GLUCOSE 105* 113* 124* 112* 108*  BUN 19 15 11  7* 12  CREATININE 1.20* 1.22* 1.15* 0.96 1.08*  CALCIUM 9.0 8.7* 9.1 9.0 9.0  PROT 6.6 5.9*  --   --   --   ALBUMIN 3.9 3.5 3.7 3.4* 3.4*  AST 34 32  --   --   --   ALT 45* 44  --   --   --   ALKPHOS 77 74  --   --   --   BILITOT 0.5 0.3  --   --   --   GFRNONAA 41* 40* 43* 54* 47*  GFRAA 48* 47* 50* >60 54*  ANIONGAP 11 10 10 9 10      Hematology Recent Labs  Lab 03/05/19 0039 03/06/19 0334 03/07/19 0731  WBC 8.7 8.3 7.8  RBC 4.27 4.37 4.53  HGB 12.9 13.1 13.5  HCT 38.7 39.1 40.5  MCV 90.6 89.5 89.4  MCH 30.2 30.0 29.8  MCHC 33.3 33.5 33.3  RDW 15.4 15.2 15.2  PLT 242 242 245    Cardiac EnzymesNo results for input(s): TROPONINI in the last 168 hours. No results for input(s): TROPIPOC in the last 168 hours.   BNPNo results for input(s): BNP, PROBNP in the last 168 hours.   DDimer No results for input(s): DDIMER in the last 168 hours.   Radiology    No results found.  Cardiac Studies   None  Patient Profile     85 y.o. female with PMH of hypothyroidism, CKD stage 3, and migraines, who is being followed by cardiology for new onset atrial fibrillation with LBBB.   Assessment & Plan    1. New onset atrial fibrillation/flutter: patient presented to urgent care with complaints of tachycardia. EKG revealed new LBBB and she was referred to [facility]. She was started on amiodarone and diltiazem gtt for rhythm/rate control, though HR's are persistently >100. She was started on apixaban for stroke ppx given CHA2DS2VASc of 3 (Female and Age >48) - Plan for TEE/DCCV today. Risks/benefits discussed with patient and daughter Cathy Lambert). Patient agrees to proceed.  - Continue amiodarone for rate control  - Continue diltiazem gtt for now - anticipate  transition to po following cardioversion.  - Continue apixaban 5mg  BID for stroke ppx - Plan for formal echo once HR improved  3 months   2. LBBB: unclear acuity as no prior EKGs available for review. No complaint of chest pain. Trop trend not consistent with ACS. - Could consider outpatient ischemic evaluation  3. Elevated troponin: High sensitivity trop 34>37. Trend not consistent with ACS. Likely demand ischemia in the setting of afib/flutter with RVR.    4. CKD stage 3: Cr stable at 1.08 today - Continue to monitor routinely.    For questions or updates, please contact [place] Please consult [email or web address] for contact info under Cardiology/STEMI.      Signed, Abigail Butts, PA-C  03/08/2019, 7:39 AM   [phone]  Pt seen and examined   I agree with findings as noted above by K Kroeger Pt comfortable post cardioversion   She never sensed palpitations in a futter    On exam: Lungs aer CTA Cardiac RRR   No S3   No murmurs Abd is benign Ext are without edema    I have reviewed EKG with EP Cathy Lambert )  Atrial flutter with variable conduction.  LBBB appears to be rate related   Now in SR  QRS has normalized   Would continue low dose amio 200 daily given that pt in aflutter and didn't sense and EF down    Keep on metoprolol for now    Will need to follow closely as outpt HR and BP   May need to adjust synthroid dose Will need f/u of LVEF as outpt   I would not plan outpt ischemic eval   Will reassess in AM  Dorris Carnes MD

## 2019-03-08 NOTE — Interval H&P Note (Signed)
History and Physical Interval Note:  03/08/2019 7:58 AM  Cathy Lambert  has presented today for surgery, with the diagnosis of AFIB.  The various methods of treatment have been discussed with the patient and family. After consideration of risks, benefits and other options for treatment, the patient has consented to  Procedure(s): TRANSESOPHAGEAL ECHOCARDIOGRAM (TEE) (N/A) CARDIOVERSION (N/A) as a surgical intervention.  The patient's history has been reviewed, patient examined, no change in status, stable for surgery.  I have reviewed the patient's chart and labs.  Questions were answered to the patient's satisfaction.     Mertie Moores

## 2019-03-08 NOTE — Progress Notes (Signed)
I have explained to this patient about her TEE procedure, what it is, risks and benefits and answered questions regarding the procedure. I also talked about the possibility of her having cardioversion and risks and benefits of that procedure and patient understands but states that the MD hasn't spoke with me and explained anything about this procedure and I want him to talk to me before I sign the consent, so it's filled out but not signed at this time.

## 2019-03-08 NOTE — Progress Notes (Signed)
Patient having ectopy, unifocal, morphology is wider complex and like the beats patient had with tachycardia pre cardioversion. Patient had a 5 beat run and denied any shortness of breath, chest pain, discomfort, palpitations or throbbing with ectopy.  Text paged PA to inform her that patient has returned from cardioversion and is in SR, continues on Cardizem and Amiodarone infusions and above ectopy.  Daleen Snook Kroeger returned page and Cardizem infusion dc'd as ordered and patient given Metoprolol as instructed. Will continue Amiodarone infusion at this time.

## 2019-03-08 NOTE — Progress Notes (Signed)
Patient ambulating in room with steady gait, tolerating well this afternoon.

## 2019-03-08 NOTE — Anesthesia Postprocedure Evaluation (Signed)
Anesthesia Post Note  Patient: Cathy Lambert  Procedure(s) Performed: TRANSESOPHAGEAL ECHOCARDIOGRAM (TEE) (N/A ) CARDIOVERSION (N/A )     Patient location during evaluation: PACU Anesthesia Type: General Level of consciousness: awake and alert Pain management: pain level controlled Vital Signs Assessment: post-procedure vital signs reviewed and stable Respiratory status: spontaneous breathing, nonlabored ventilation, respiratory function stable and patient connected to nasal cannula oxygen Cardiovascular status: blood pressure returned to baseline and stable Postop Assessment: no apparent nausea or vomiting Anesthetic complications: no    Last Vitals:  Vitals:   03/08/19 0935 03/08/19 1153  BP: 118/80 129/77  Pulse: 62 70  Resp: 15 20  Temp:  36.8 C  SpO2: 99% 100%    Last Pain:  Vitals:   03/08/19 1153  TempSrc: Oral  PainSc:                  Pancho Rushing P Mckay Brandt

## 2019-03-08 NOTE — Progress Notes (Signed)
  Interim    85 year old Caucasian female retirement community resident History of hypothyroidism occasional orthostasis, insomnia using minimal Ativan, knee osteoarthritis, migraines Admitted overnight early morning 7/17 4 days of cardia after being seen in urgent care with wide-complex tachycardia 140s-loaded with amiodarone in the ED Cardiology consulted developing some hypotension 2/2 treatement of Flutter likely iatrogenic 2/2 Cardizem Cardioverted 03/08/2019 Impression  Atrial tachycardia Cardioversion performed 7/20 on IV amiodarone other meds as per cardiologist Sinus rhythm at this time Converted to apixaban twice daily post procedure Diltiazem discontinued-current metoprolol 25 twice daily Magnesium okay at 2.3 Hypothyroidism Continue custom doses of Synthroid-TSH 3 weeks Mild AKI, mild metabolic acidosis resolved   Subjective/interval events    S/p DCCV-looks well doing fine, no CP No fever, eating no chills no rigor  at this time It looks like wide-complex tachycardia and discontinuation of some drips today per cardiology   Objective     BP 129/77 (BP Location: Left Wrist)   Pulse 70   Temp 98.3 F (36.8 C) (Oral)   Resp 20   Ht 5' 6.5" (1.689 m)   Wt 78.4 kg   SpO2 100%   BMI 27.47 kg/m      Intake/Output Summary (Last 24 hours) at 03/08/2019 1344 Last data filed at 03/08/2019 1002 Gross per 24 hour  Intake 1554.96 ml  Output 950 ml  Net 604.96 ml    GPE  Coherent pleasant no distress no icterus no pallor Chest clear no added sound S1-S2 in sinus rhythm Abdomen soft nontender no lower extremity edema Moving all 4 limbs Neurologically intact no deficit  Cardioversion procedure note 03/08/2019    Left Ventrical:  Mod LV dysfunction.   EF 35-40%.  Hypokinesis of ant. Septum   Mitral Valve: mild MR   Aortic Valve: 3 leaflet AV,   Mild AI   Tricuspid Valve: mild TR   Pulmonic Valve: normal structure.  Mild PI   Left Atrium/ Left atrial  appendage: large LAA.  No thrombi   Atrial septum: intact  Aorta: mod calcified plaque in arch   The patient has been on adequate anticoagulation.  The patient received IV Propofol drip - total of 170 mg for TEE and cardioversion  for sedation.  Synchronous cardioversion was performed at  150  joules. None  Glycemic control ordered: No SUP w/ no DVT prophylaxis w: On heparin Disposition   Code status: Full

## 2019-03-08 NOTE — CV Procedure (Signed)
    Transesophageal Echocardiogram Note  MARLON VONRUDEN [id number] 07/27/1931  Procedure: Transesophageal Echocardiogram Indications: atrial fib   Procedure Details Consent: Obtained Time Out: Verified patient identification, verified procedure, site/side was marked, verified correct patient position, special equipment/implants available, Radiology Safety Procedures followed,  medications/allergies/relevent history reviewed, required imaging and test results available.  Performed  Medications:  During this procedure the patient is administered a  Propofol drip by CRNA , Thedore Mins.   Total of 170 mg Propofol given.  Left Ventrical:  Mod LV dysfunction.   EF 35-40%.  Hypokinesis of ant. Septum   Mitral Valve: mild MR   Aortic Valve: 3 leaflet AV,   Mild AI   Tricuspid Valve: mild TR   Pulmonic Valve: normal structure.  Mild PI   Left Atrium/ Left atrial appendage: large LAA.  No thrombi   Atrial septum: intact  Aorta: mod calcified plaque in arch    Complications: No apparent complications Patient did tolerate procedure well.      Cardioversion Note  HELI DINO [id number] 12/28/1930  Procedure: DC Cardioversion Indications: atrial fib   Procedure Details Consent: Obtained Time Out: Verified patient identification, verified procedure, site/side was marked, verified correct patient position, special equipment/implants available, Radiology Safety Procedures followed,  medications/allergies/relevent history reviewed, required imaging and test results available.  Performed  The patient has been on adequate anticoagulation.  The patient received IV Propofol drip - total of 170 mg for TEE and cardioversion  for sedation.  Synchronous cardioversion was performed at  150  joules.  The cardioversion was successful     Complications: No apparent complications Patient did tolerate procedure well.   Thayer Headings, Brooke Bonito., MD, [facility] 03/08/2019, 8:43 AM

## 2019-03-08 NOTE — Anesthesia Procedure Notes (Signed)
Procedure Name: MAC Date/Time: 03/08/2019 8:06 AM Performed by: Mariea Clonts, CRNA Pre-anesthesia Checklist: Patient identified, Emergency Drugs available, Suction available, Patient being monitored and Timeout performed Patient Re-evaluated:Patient Re-evaluated prior to induction Oxygen Delivery Method: Nasal cannula and Simple face mask

## 2019-03-08 NOTE — Transfer of Care (Signed)
Immediate Anesthesia Transfer of Care Note  Patient: MAUDIE SHINGLEDECKER  Procedure(s) Performed: TRANSESOPHAGEAL ECHOCARDIOGRAM (TEE) (N/A ) CARDIOVERSION (N/A )  Patient Location: Endoscopy Unit  Anesthesia Type:MAC and General  Level of Consciousness: awake, alert  and oriented  Airway & Oxygen Therapy: Patient Spontanous Breathing and Patient connected to nasal cannula oxygen  Post-op Assessment: Report given to RN, Post -op Vital signs reviewed and stable and Patient moving all extremities X 4  Post vital signs: Reviewed and stable  Last Vitals:  Vitals Value Taken Time  BP    Temp 36.5 C 03/08/19 0844  Pulse 67 03/08/19 0844  Resp 11 03/08/19 0844  SpO2 98 % 03/08/19 0844    Last Pain:  Vitals:   03/08/19 0844  TempSrc: Axillary  PainSc: 0-No pain      Patients Stated Pain Goal: 0 (81/01/75 1025)  Complications: No apparent anesthesia complications

## 2019-03-09 ENCOUNTER — Telehealth: Payer: Self-pay

## 2019-03-09 ENCOUNTER — Encounter (HOSPITAL_COMMUNITY): Payer: Self-pay | Admitting: Cardiovascular Disease

## 2019-03-09 LAB — RENAL FUNCTION PANEL
Albumin: 3.2 g/dL — ABNORMAL LOW (ref 3.5–5.0)
Anion gap: 7 (ref 5–15)
BUN: 16 mg/dL (ref 8–23)
CO2: 24 mmol/L (ref 22–32)
Calcium: 8.8 mg/dL — ABNORMAL LOW (ref 8.9–10.3)
Chloride: 108 mmol/L (ref 98–111)
Creatinine, Ser: 1.16 mg/dL — ABNORMAL HIGH (ref 0.44–1.00)
GFR calc Af Amer: 50 mL/min — ABNORMAL LOW (ref >60)
GFR calc non Af Amer: 43 mL/min — ABNORMAL LOW (ref >60)
Glucose, Bld: 91 mg/dL (ref 70–99)
Phosphorus: 4.2 mg/dL (ref 2.5–4.6)
Potassium: 4.1 mmol/L (ref 3.5–5.1)
Sodium: 139 mmol/L (ref 135–145)

## 2019-03-09 LAB — MAGNESIUM: Magnesium: 2.3 mg/dL (ref 1.7–2.4)

## 2019-03-09 MED ORDER — AMIODARONE HCL 200 MG PO TABS: 200.0000 mg | ORAL_TABLET | Freq: Every day | ORAL | 0 refills | Status: DC

## 2019-03-09 MED ORDER — APIXABAN 5 MG PO TABS: 5.0000 mg | ORAL_TABLET | Freq: Two times a day (BID) | ORAL | 12 refills | Status: DC

## 2019-03-09 MED ORDER — METOPROLOL TARTRATE 25 MG PO TABS: 25.0000 mg | ORAL_TABLET | Freq: Two times a day (BID) | ORAL | 3 refills | Status: DC

## 2019-03-09 MED FILL — ELIQUIS 5 MG TABLET: 5 | 30 days supply | Qty: 60 | Fill #0

## 2019-03-09 MED FILL — METOPROLOL TARTRATE 25 MG T: 25 | 30 days supply | Qty: 60 | Fill #0

## 2019-03-09 MED FILL — AMIODARONE HCL 200 MG TAB: 200 | 14 days supply | Qty: 14 | Fill #0

## 2019-03-09 NOTE — Discharge Summary (Signed)
Physician Discharge Summary  Cathy Lambert States WUJ:[id number] DOB: 1931-06-13 DOA: 03/04/2019  PCP: Willow OraAndy, Camille L, MD  Admit date: 03/04/2019 Discharge date: 03/09/2019  Time spent: 25 minutes  Recommendations for Outpatient Follow-up:  1. New medications Eliquis, amiodarone, metoprolol 2. May need down titration of amiodarone in the outpatient setting-given only 14 tablets 3. Needs magnesium, Chem-12, TSH about 1 to 2 weeks can be done at cardiology office  Discharge Diagnoses:  Principal Problem:   Wide-complex tachycardia (HCC) Active Problems:   Acquired hypothyroidism   Primary osteoarthritis of knee   Atrial fibrillation [facility](HCC)   Discharge Condition: Improved  Diet recommendation: Heart healthy  Filed Weights   03/08/19 0455 03/08/19 0704 03/09/19 0644  Weight: 78.4 kg 78.4 kg 79.2 kg    History of present illness:   85 year old Caucasian female retirement community resident History of hypothyroidism occasional orthostasis, insomnia using minimal Ativan, knee osteoarthritis, migraines Admitted overnight early morning 7/17 4 days of cardia after being seen in urgent care with wide-complex tachycardia 140s-loaded with amiodarone in the ED Cardiology consulted developing some hypotension 2/2 treatement of Flutter likely iatrogenic 2/2 Cardizem Cardioverted 03/08/2019  Hospital Course:  Atrial tachycardia Cardioversion performed 7/20 on IV amiodarone other meds as per cardiologist Sinus rhythm at this time Converted to apixaban twice daily post procedure Diltiazem discontinued-current metoprolol 25 twice daily Magnesium okay at 2.3 Hypothyroidism Continue custom doses of Synthroid-TSH 3 weeks Mild AKI, mild metabolic acidosis resolved   Procedures: Cardioversion procedure note 03/08/2019    Left Ventrical:Mod LV dysfunction. EF 35-40%. Hypokinesis of ant. Septum   Mitral Valve:mild MR  Aortic Valve:3 leaflet AV, Mild AI   Tricuspid  Valve:mild TR  Pulmonic Valve:normal structure. Mild PI   Left Atrium/ Left atrial appendage:large LAA. No thrombi   Atrial septum:intact  Aorta:mod calcified plaque in arch  The patient has been on adequate anticoagulation. The patient received IV Propofol drip - total of 170 mg for TEE and cardioversionfor sedation. Synchronous cardioversion was performed at 150joules.    Consultations:  Cardiology  Discharge Exam: Vitals:   03/08/19 2144 03/09/19 0644  BP: (!) 148/84 (!) 142/91  Pulse: 80 73  Resp: 20 20  Temp: 98.6 F (37 C) 98.3 F (36.8 C)  SpO2: 97% 100%  Very pleasant coherent no distress eating and drinking no chest pain no fever no chills Ambulating in the unit without any fast heart rate no palpitations no discomfort  General: External ocular movements intact thyromegaly absent, submandibular lymphadenopathy absent, throat soft supple Cardiovascular: S1-S2 sinus rhythm in monitors and does have some PVCs which are nonsustained Respiratory: Clinically clear no added sound no rales no rhonchi Abdomen soft nontender no rebound no guarding Neurologically intact  Discharge Instructions   Discharge Instructions    Diet - low sodium heart healthy   Complete by: As directed    Discharge instructions   Complete by: As directed    Ensure that you take all your medications without fail and make sure that you follow-up with your appointment by cardiology-as discussed they may either consolidate or change your amiodarone dosing at their discretion when they see you in the office Suggest lab work work in about 1 week at the cardiology office Please report any high heart rates >140 and over significant palpitations chest pain/or bleeding from any orifice to your doctor Go slow and continue to do activity in a judicious fashion Take care and have a good summer   Increase activity slowly   Complete by: As directed  Allergies as of 03/09/2019    No Known Allergies     Medication List    TAKE these medications   amiodarone 200 MG tablet Commonly known as: PACERONE Take 1 tablet (200 mg total) by mouth daily. Start taking on: March 10, 2019   apixaban 5 MG Tabs tablet Commonly known as: ELIQUIS Take 1 tablet (5 mg total) by mouth 2 (two) times daily.   levothyroxine 50 MCG tablet Commonly known as: SYNTHROID Take 100mg  on Tu and Th, 50mg  on all other days What changed:   how much to take  how to take this  when to take this  additional instructions   LORazepam 2 MG tablet Commonly known as: ATIVAN TAKE 0.5 TABLETS (1 MG TOTAL) BY MOUTH AT BEDTIME AS NEEDED FOR SLEEP.   Magnesium 500 MG Tabs Take 500 mg by mouth at bedtime.   metoprolol tartrate 25 MG tablet Commonly known as: LOPRESSOR Take 1 tablet (25 mg total) by mouth 2 (two) times daily.   PROBIOTIC DAILY PO Take 1 capsule by mouth daily.   VITAMIN B COMPLEX PO Take 1 tablet by mouth daily with breakfast.   VITAMIN D-3 PO Take 1 capsule by mouth daily with breakfast.      No Known Allergies Follow-up Information    Willow OraAndy, Camille L, MD.   Specialty: Family Medicine Contact information: [address] [place] [address] [phone]        Parke PoissonAcharya, Gayatri A, MD Follow up on 03/17/2019.   Specialty: Cardiology Why: Please arrive 15 minutes early for your 4:00pm post-hospital cardiology appointment.  Contact information: [address] [place] [address] [phone]            The results of significant diagnostics from this hospitalization (including imaging, microbiology, ancillary and laboratory) are listed below for reference.    Significant Diagnostic Studies: Dg Chest Portable 1 View  Result Date: 03/04/2019 CLINICAL DATA:  Pt states she has had a high heart rate for 6 days. No other issues. Denies chest pain and SOB. No known heart or lung issues or surgeries. Pt is a nonsmoker. EXAM: PORTABLE  CHEST 1 VIEW COMPARISON:  02/13/2011 FINDINGS: The heart size and mediastinal contours are within normal limits. Both lungs are clear. The visualized skeletal structures are unremarkable. IMPRESSION: No active disease. Electronically Signed   By: Norva PavlovElizabeth  Brown Lambert.D.   On: 03/04/2019 19:57    Microbiology: Recent Results (from the past 240 hour(s))  SARS Coronavirus 2 (CEPHEID - Performed in [facility] hospital lab), Hosp Order     Status: None   Collection Time: 03/04/19  9:16 PM   Specimen: Nasopharyngeal Swab  Result Value Ref Range Status   SARS Coronavirus 2 NEGATIVE NEGATIVE Final    Comment: (NOTE) If result is NEGATIVE SARS-CoV-2 target nucleic acids are NOT DETECTED. The SARS-CoV-2 RNA is generally detectable in upper and lower  respiratory specimens during the acute phase of infection. The lowest  concentration of SARS-CoV-2 viral copies this assay can detect is 250  copies / mL. A negative result does not preclude SARS-CoV-2 infection  and should not be used as the sole basis for treatment or other  patient management decisions.  A negative result may occur with  improper specimen collection / handling, submission of specimen other  than nasopharyngeal swab, presence of viral mutation(s) within the  areas targeted by this assay, and inadequate number of viral copies  (<250 copies / mL). A negative result must be combined  with clinical  observations, patient history, and epidemiological information. If result is POSITIVE SARS-CoV-2 target nucleic acids are DETECTED. The SARS-CoV-2 RNA is generally detectable in upper and lower  respiratory specimens dur ing the acute phase of infection.  Positive  results are indicative of active infection with SARS-CoV-2.  Clinical  correlation with patient history and other diagnostic information is  necessary to determine patient infection status.  Positive results do  not rule out bacterial infection or co-infection with other  viruses. If result is PRESUMPTIVE POSTIVE SARS-CoV-2 nucleic acids MAY BE PRESENT.   A presumptive positive result was obtained on the submitted specimen  and confirmed on repeat testing.  While 2019 novel coronavirus  (SARS-CoV-2) nucleic acids may be present in the submitted sample  additional confirmatory testing may be necessary for epidemiological  and / or clinical management purposes  to differentiate between  SARS-CoV-2 and other Sarbecovirus currently known to infect humans.  If clinically indicated additional testing with an alternate test  methodology [phone]([id number]) is advised. The SARS-CoV-2 RNA is generally  detectable in upper and lower respiratory sp ecimens during the acute  phase of infection. The expected result is Negative. Fact Sheet for Patients:  BoilerBrush.com.cyhttps://www.fda.gov/media/136312/download Fact Sheet for Healthcare Providers: https://pope.com/https://www.fda.gov/media/136313/download This test is not yet approved or cleared by the [place] FDA and has been authorized for detection and/or diagnosis of SARS-CoV-2 by FDA under an Emergency Use Authorization (EUA).  This EUA will remain in effect (meaning this test can be used) for the duration of the COVID-19 declaration under Section 564(b)(1) of the Act, 21 U.S.C. section 360bbb-3(b)(1), unless the authorization is terminated or revoked sooner. Performed at [facility], [address] [place], [place] [address]      Labs: Basic Metabolic Panel: Recent Labs  Lab 03/04/19 2106 03/05/19 0039 03/06/19 0809 03/07/19 0731 03/08/19 0406 03/08/19 1013 03/09/19 0455  NA 140 138 138 140 140  --  139  K 4.0 3.6 3.9 3.7 4.0  --  4.1  CL 108 107 106 109 108  --  108  CO2 21* 21* 22 22 22   --  24  GLUCOSE 105* 113* 124* 112* 108*  --  91  BUN 19 15 11  7* 12  --  16  CREATININE 1.20* 1.22* 1.15* 0.96 1.08*  --  1.16*  CALCIUM 9.0 8.7* 9.1 9.0 9.0  --  8.8*  MG 2.3  --  2.2  --  DUPLICATE REQUEST 2.3 2.3  PHOS 3.7  --  3.0  3.2 4.1  --  4.2   Liver Function Tests: Recent Labs  Lab 03/04/19 2106 03/05/19 0039 03/06/19 0809 03/07/19 0731 03/08/19 0406 03/09/19 0455  AST 34 32  --   --   --   --   ALT 45* 44  --   --   --   --   ALKPHOS 77 74  --   --   --   --   BILITOT 0.5 0.3  --   --   --   --   PROT 6.6 5.9*  --   --   --   --   ALBUMIN 3.9 3.5 3.7 3.4* 3.4* 3.2*   No results for input(s): LIPASE, AMYLASE in the last 168 hours. No results for input(s): AMMONIA in the last 168 hours. CBC: Recent Labs  Lab 03/04/19 2106 03/05/19 0039 03/06/19 0334 03/07/19 0731  WBC 8.3 8.7 8.3 7.8  NEUTROABS  --  4.2  --  4.0  HGB 13.4 12.9 13.1 13.5  HCT 40.8 38.7 39.1 40.5  MCV 91.9 90.6 89.5 89.4  PLT 239 242 242 245   Cardiac Enzymes: No results for input(s): CKTOTAL, CKMB, CKMBINDEX, TROPONINI in the last 168 hours. BNP: BNP (last 3 results) No results for input(s): BNP in the last 8760 hours.  ProBNP (last 3 results) No results for input(s): PROBNP in the last 8760 hours.  CBG: No results for input(s): GLUCAP in the last 168 hours.     Signed:  Nita Sells MD   [facility] 03/09/2019, 10:30 AM

## 2019-03-09 NOTE — Telephone Encounter (Signed)
TOC call for 7/22. Pt has an appointment on 03/17/19 with Dr. Margaretann Loveless. Pt is being discharged today.

## 2019-03-09 NOTE — Care Management Important Message (Signed)
Important Message  Patient Details  Name: Cathy Lambert MRN: [id number] Date of Birth: May 24, 1931   Medicare Important Message Given:  Yes     Shelda Altes 03/09/2019, 11:28 AM

## 2019-03-09 NOTE — Progress Notes (Signed)
Progress Note  Patient Name: Cathy Lambert Date of Encounter: 03/09/2019  Primary Cardiologist: Parke PoissonGayatri A Acharya, MD   Subjective   Pt feels good   No CP    Inpatient Medications    Scheduled Meds: . amiodarone  200 mg Oral Daily  . apixaban  5 mg Oral BID  . levothyroxine  50 mcg Oral Once per day on Sun Mon Wed Fri Sat   And  . levothyroxine  100 mcg Oral Once per day on Tue Thu  . magnesium oxide  400 mg Oral QHS  . metoprolol tartrate  25 mg Oral BID   Continuous Infusions:  PRN Meds: acetaminophen **OR** acetaminophen, docusate sodium, LORazepam, ondansetron **OR** ondansetron (ZOFRAN) IV, white petrolatum   Vital Signs    Vitals:   03/08/19 0935 03/08/19 1153 03/08/19 2144 03/09/19 0644  BP: 118/80 129/77 (!) 148/84 (!) 142/91  Pulse: 62 70 80 73  Resp: 15 20 20 20   Temp:  98.3 F (36.8 C) 98.6 F (37 C) 98.3 F (36.8 C)  TempSrc:  Oral Oral Oral  SpO2: 99% 100% 97% 100%  Weight:    79.2 kg  Height:        Intake/Output Summary (Last 24 hours) at 03/09/2019 0659 Last data filed at 03/09/2019 0650 Gross per 24 hour  Intake 712.6 ml  Output -  Net 712.6 ml   Filed Weights   03/08/19 0455 03/08/19 0704 03/09/19 0644  Weight: 78.4 kg 78.4 kg 79.2 kg    Telemetry    SR      Physical Exam   GEN: Sitting upright in bed in no acute distress.   Neck: No JVD, no carotid bruits Cardiac: RRR   , no murmurs, rubs, or gallops.  Respiratory: Clear to auscultation bilaterally, no wheezes/ rales/ rhonchi GI: NABS, Soft, nontender, non-distended  MS: No edema; No deformity. Neuro:  Nonfocal, moving all extremities spontaneously Psych: Normal affect   Labs    Chemistry Recent Labs  Lab 03/04/19 2106 03/05/19 0039  03/07/19 0731 03/08/19 0406 03/09/19 0455  NA 140 138   < > 140 140 139  K 4.0 3.6   < > 3.7 4.0 4.1  CL 108 107   < > 109 108 108  CO2 21* 21*   < > 22 22 24   GLUCOSE 105* 113*   < > 112* 108* 91  BUN 19 15   < > 7* 12 16   CREATININE 1.20* 1.22*   < > 0.96 1.08* 1.16*  CALCIUM 9.0 8.7*   < > 9.0 9.0 8.8*  PROT 6.6 5.9*  --   --   --   --   ALBUMIN 3.9 3.5   < > 3.4* 3.4* 3.2*  AST 34 32  --   --   --   --   ALT 45* 44  --   --   --   --   ALKPHOS 77 74  --   --   --   --   BILITOT 0.5 0.3  --   --   --   --   GFRNONAA 41* 40*   < > 54* 47* 43*  GFRAA 48* 47*   < > >60 54* 50*  ANIONGAP 11 10   < > 9 10 7    < > = values in this interval not displayed.     Hematology Recent Labs  Lab 03/05/19 0039 03/06/19 0334 03/07/19 0731  WBC 8.7 8.3 7.8  RBC  4.27 4.37 4.53  HGB 12.9 13.1 13.5  HCT 38.7 39.1 40.5  MCV 90.6 89.5 89.4  MCH 30.2 30.0 29.8  MCHC 33.3 33.5 33.3  RDW 15.4 15.2 15.2  PLT 242 242 245    Cardiac EnzymesNo results for input(s): TROPONINI in the last 168 hours. No results for input(s): TROPIPOC in the last 168 hours.   BNPNo results for input(s): BNP, PROBNP in the last 168 hours.   DDimer No results for input(s): DDIMER in the last 168 hours.   Radiology    No results found.  Cardiac Studies   None  Patient Profile     85 y.o. female with PMH of hypothyroidism, CKD stage 3, and migraines, who is being followed by cardiology for new onset atrial fibrillation with LBBB.   Assessment & Plan    1. New onset atrial flutter:  Pt with new atrial flutter with RVR  LVEF depressed   She did not sense palpitations   REquired dilt and amiodarone .   Now s/p cardioversion    She remains in SR this AM   I would continue on current regimen   Will follow EKG as outpt    Eventually taper amio but not for awhile  B Ack off on b blocker first   Without amiodarone she prob would revert to atrial flutter   2  LV dysfunction   ? Rate related   Pt will need f/u echo in a few months to see if improves (tachy induced)    2. LBBB: Rate related   Pt with narrow complex when back in SR  Follow  3. Elevated troponin: High sensitivity trop 34>37. Trend not consistent with ACS. Likely demand  ischemia in the setting of afib/flutter with RVR. Pt comfortable    . OK for d/c   For questions or updates, please contact [facility] Please consult [email or web address] for contact info under Cardiology/STEMI.      Signed, Dorris Carnes, MD  03/09/2019, 6:59 AM   [phone]

## 2019-03-10 ENCOUNTER — Telehealth: Payer: Self-pay

## 2019-03-10 NOTE — Telephone Encounter (Signed)
Patient contacted regarding discharge from [facility] on 03/09/19.  Patient understands to follow up with provider Dr. Margaretann Loveless on 03/17/19 at 4:00 pm at [facility]. Patient understands discharge instructions? Yes Patient understands medications and regiment? Yes Patient understands to bring all medications to this visit? yes

## 2019-03-10 NOTE — Telephone Encounter (Signed)
PER CHART REVIEW _________________________  Admit date: 03/04/2019 Discharge date: 03/09/2019  Time spent: 25 minutes  Recommendations for Outpatient Follow-up:  1. New medications Eliquis, amiodarone, metoprolol 2. May need down titration of amiodarone in the outpatient setting-given only 14 tablets 3. Needs magnesium, Chem-12, TSH about 1 to 2 weeks can be done at cardiology office  Discharge Diagnoses:  Principal Problem:   Wide-complex tachycardia ([place]) Active Problems:   Acquired hypothyroidism   Primary osteoarthritis of knee   Atrial fibrillation [facility])  Discharge Condition: Improved  Diet recommendation: Heart healthy    PER TELEPHONE CALL _____________________________  Transition Care Management Follow-up Telephone Call   Date discharged? 03/09/19   How have you been since you were released from the hospital? Patient is doing well.   Do you understand why you were in the hospital? yes   Do you understand the discharge instructions? yes   Where were you discharged to? Home     Items Reviewed:  Medications reviewed: yes  Allergies reviewed: yes  Dietary changes reviewed: yes  Referrals reviewed: yes   Functional Questionnaire:   Activities of Daily Living (ADLs):   She states they are independent in the following: ambulation, bathing and hygiene, feeding, continence, grooming, toileting and dressing States they require assistance with the following: N/A   Any transportation issues/concerns?: no   Any patient concerns? no   Confirmed importance and date/time of follow-up visits scheduled Yes, Patient will follow up with Cardiologist.  Provider Appointment booked with N/A  Confirmed with patient if condition begins to worsen call PCP or go to the ER.  Patient was given the office number and encouraged to call back with question or concerns.  : yes

## 2019-03-17 ENCOUNTER — Ambulatory Visit: Payer: Medicare Other | Admitting: Internal Medicine

## 2019-03-17 ENCOUNTER — Other Ambulatory Visit: Payer: Self-pay

## 2019-03-17 VITALS — BP 166/92 | HR 68 | Temp 98.7°F | Ht 66.5 in | Wt 174.0 lb

## 2019-03-17 DIAGNOSIS — E039 Hypothyroidism, unspecified: Secondary | ICD-10-CM | POA: Diagnosis not present

## 2019-03-17 DIAGNOSIS — Z7901 Long term (current) use of anticoagulants: Secondary | ICD-10-CM

## 2019-03-17 DIAGNOSIS — I4892 Unspecified atrial flutter: Secondary | ICD-10-CM | POA: Diagnosis not present

## 2019-03-17 DIAGNOSIS — E785 Hyperlipidemia, unspecified: Secondary | ICD-10-CM

## 2019-03-17 MED ORDER — AMIODARONE HCL 200 MG PO TABS: 200.0000 mg | ORAL_TABLET | Freq: Every day | ORAL | 2 refills | Status: DC

## 2019-03-17 NOTE — Progress Notes (Signed)
Cardiology Office Note:    Date:  03/17/2019   ID:  Malaiah, Cathy Lambert 13-Oct-1930, MRN [id number]  PCP:  Leamon Arnt, MD  Cardiologist:  Elouise Munroe, MD  Electrophysiologist:  None   Referring MD: Leamon Arnt, MD   Chief Complaint: post hospital follow  History of Present Illness:    Cathy Lambert is a 85 y.o. female with a hx of hypothyroidism and migraines who was recently admitted for tachycardia and fatigue found to have new onset atrial flutter with RVR, requiring TEE cardioversion as well as metoprolol and amiodarone. Doing well, feels well, no complaints.   I answered many questions regarding natural history of atrial flutter, treatment, and modification of risk factors. Tolerating eliquis well, no bleeding. No issues with amiodarone or metoprolol.   The patient denies chest pain, chest pressure, dyspnea at rest or with exertion, palpitations, PND, orthopnea, or leg swelling. Denies syncope or presyncope. Denies dizziness or lightheadedness. Denies snoring and has not been evaluated for sleep apnea.  Past Medical History:  Diagnosis Date  . Migraines   . Primary osteoarthritis of knee   . Thyroid disease     Past Surgical History:  Procedure Laterality Date  . BUNIONECTOMY  1991  . CARDIOVERSION N/A 03/08/2019   Procedure: CARDIOVERSION;  Surgeon: Acie Fredrickson Wonda Cheng, MD;  Location: [facility] ENDOSCOPY;  Service: Cardiovascular;  Laterality: N/A;  . CATARACT EXTRACTION  2008-2009  . TEE WITHOUT CARDIOVERSION N/A 03/08/2019   Procedure: TRANSESOPHAGEAL ECHOCARDIOGRAM (TEE);  Surgeon: Thayer Headings, MD;  Location: [facility] ENDOSCOPY;  Service: Cardiovascular;  Laterality: N/A;  . TONSILLECTOMY AND ADENOIDECTOMY  1953    Current Medications: Current Meds  Medication Sig  . amiodarone (PACERONE) 200 MG tablet Take 1 tablet (200 mg total) by mouth daily.  [name] Kitchen apixaban (ELIQUIS) 5 MG TABS tablet Take 1 tablet (5 mg total) by mouth 2 (two) times daily.  . B Complex Vitamins  (VITAMIN B COMPLEX PO) Take 1 tablet by mouth daily with breakfast.   . Cholecalciferol (VITAMIN D-3 PO) Take 1 capsule by mouth daily with breakfast.  . levothyroxine (SYNTHROID, LEVOTHROID) 50 MCG tablet Take 100mg  on Tu and Th, 50mg  on all other days (Patient taking differently: Take 50-100 mcg by mouth See admin instructions. Take 50 mcg by mouth in the morning before breakfast on Sun/Mon/Wed/Fri/Sat and 100 mcg on Tues/Thurs)  . LORazepam (ATIVAN) 2 MG tablet TAKE 0.5 TABLETS (1 MG TOTAL) BY MOUTH AT BEDTIME AS NEEDED FOR SLEEP.  . Magnesium 500 MG TABS Take 500 mg by mouth at bedtime.  . metoprolol tartrate (LOPRESSOR) 25 MG tablet Take 1 tablet (25 mg total) by mouth 2 (two) times daily.  . Probiotic Product (PROBIOTIC DAILY PO) Take 1 capsule by mouth daily.   . [DISCONTINUED] amiodarone (PACERONE) 200 MG tablet Take 1 tablet (200 mg total) by mouth daily.     Allergies:   Patient has no known allergies.   Social History   Socioeconomic History  . Marital status: Divorced    Spouse name: Not on file  . Number of children: Not on file  . Years of education: Not on file  . Highest education level: Not on file  Occupational History  . Not on file  Social Needs  . Financial resource strain: Not on file  . Food insecurity    Worry: Not on file    Inability: Not on file  . Transportation needs    Medical: Not on file  Non-medical: Not on file  Tobacco Use  . Smoking status: Never Smoker  . Smokeless tobacco: Never Used  Substance and Sexual Activity  . Alcohol use: No    Frequency: Never  . Drug use: No  . Sexual activity: Never    Birth control/protection: Post-menopausal  Lifestyle  . Physical activity    Days per week: Not on file    Minutes per session: Not on file  . Stress: Not on file  Relationships  . Social Musicianconnections    Talks on phone: Not on file    Gets together: Not on file    Attends religious service: Not on file    Active member of club or  organization: Not on file    Attends meetings of clubs or organizations: Not on file    Relationship status: Not on file  Other Topics Concern  . Not on file  Social History Narrative  . Not on file     Family History: The patient's family history includes Arthritis in her mother; Cancer in her maternal grandmother, mother, paternal grandfather, sister, and son; Diabetes in her brother; Hearing loss in her sister; Stroke in her maternal grandfather.  ROS:   Please see the history of present illness.    All other systems reviewed and are negative.  EKGs/Labs/Other Studies Reviewed:    The following studies were reviewed today:  EKG:  Nsr, nonspecific t wave abnl  Recent Labs: 03/04/2019: TSH 0.840 03/05/2019: ALT 44 03/07/2019: Hemoglobin 13.5; Platelets 245 03/09/2019: BUN 16; Creatinine, Ser 1.16; Magnesium 2.3; Potassium 4.1; Sodium 139  Recent Lipid Panel    Component Value Date/Time   CHOL 202 (H) 10/21/2018 1027   TRIG 71.0 10/21/2018 1027   HDL 80.70 10/21/2018 1027   CHOLHDL 3 10/21/2018 1027   VLDL 14.2 10/21/2018 1027   LDLCALC 107 (H) 10/21/2018 1027    Physical Exam:    VS:  BP (!) 166/92   Pulse 68   Temp 98.7 F (37.1 C)   Ht 5' 6.5" (1.689 m)   Wt 174 lb (78.9 kg)   SpO2 93%   BMI 27.66 kg/m     Wt Readings from Last 5 Encounters:  03/17/19 174 lb (78.9 kg)  03/09/19 174 lb 9.7 oz (79.2 kg)  10/21/18 171 lb 6.4 oz (77.7 kg)  06/24/18 170 lb 9.6 oz (77.4 kg)  04/01/18 173 lb 6 oz (78.6 kg)     Constitutional: No acute distress Eyes: sclera non-icteric, normal conjunctiva and lids ENMT: normal dentition, moist mucous membranes Cardiovascular: regular rhythm, normal rate, no murmurs. S1 and S2 normal. Radial pulses normal bilaterally. No jugular venous distention.  Respiratory: clear to auscultation bilaterally GI : normal bowel sounds, soft and nontender. No distention.   MSK: extremities warm, well perfused. No edema.  NEURO: grossly nonfocal  exam, moves all extremities. PSYCH: alert and oriented x 3, normal mood and affect.   ASSESSMENT:    1. Atrial flutter, unspecified type (HCC)   2. Acquired hypothyroidism   3. Hyperlipidemia, unspecified hyperlipidemia type   4. Long term (current) use of anticoagulants    PLAN:    Atrial flutter- currently maintaining sinus rhythm. Continue amiodarone for at least 3 mo, recheck CMP, TSH. Continue metoprolol. BP spuriously high today due to white coat syndrome per patient. Continue eliquis. Patient would like more information about management of atrial arrhythmias, we will set her up to see our expert colleagues in Afib clinic.   HLD - suboptimal LDL in march,  will recheck lipids and advise when resulted. May need low dose statin therapy.   Hypothyroid - per primary. Per hospital notes will recheck TSH and forward to primary for adjustment of synthroid if needed.   TIME SPENT WITH PATIENT:40 minutes of direct patient care. More than 50% of that time was spent on coordination of care and counseling regarding aflutter therapy and managment.  Weston BrassGayatri Alycea Segoviano, MD [place]  [facility]   Medication Adjustments/Labs and Tests Ordered: Current medicines are reviewed at length with the patient today.  Concerns regarding medicines are outlined above.  Orders Placed This Encounter  Procedures  . Ambulatory referral to Cardiology  . EKG 12-Lead   Meds ordered this encounter  Medications  . amiodarone (PACERONE) 200 MG tablet    Sig: Take 1 tablet (200 mg total) by mouth daily.    Dispense:  30 tablet    Refill:  2    Patient Instructions  Medication Instructions:    CONTINUE AMIODARONE   If you need a refill on your cardiac medications before your next appointment, please call your pharmacy.   Lab work:    NOT NEEDED  Testing/Procedures: NOT NEEDED  Follow-Up: At [facility], you and your health needs are our priority.  As part of our continuing mission to  provide you with exceptional heart care, we have created designated Provider Care Teams.  These Care Teams include your primary Cardiologist (physician) and Advanced Practice Providers (APPs -  Physician Assistants and Nurse Practitioners) who all work together to provide you with the care you need, when you need it. . You will need a follow up appointment in  6 months.  Please call our office 2 months in advance to schedule this appointment.  You may see Parke PoissonGayatri A Ilya Ess, MD*or one of the following Advanced Practice Providers on your designated Care Team:   . Theodore DemarkRhonda Barrett, PA-C . Joni ReiningKathryn Lawrence, DNP, ANP  Any Other Special Instructions Will Be Listed Below (If Applicable).    You have been referred to  AF[facility] CLINIC -- [facility] AT [facility]  - ENTRANCE C - OFF [address]

## 2019-03-17 NOTE — Patient Instructions (Addendum)
Medication Instructions:    CONTINUE AMIODARONE   If you need a refill on your cardiac medications before your next appointment, please call your pharmacy.   Lab work: Cmp,lipid,tsh - will mail labslip - please do the first part of Aug 2020  Testing/Procedures: NOT NEEDED  Follow-Up: At [facility], you and your health needs are our priority.  As part of our continuing mission to provide you with exceptional heart care, we have created designated Provider Care Teams.  These Care Teams include your primary Cardiologist (physician) and Advanced Practice Providers (APPs -  Physician Assistants and Nurse Practitioners) who all work together to provide you with the care you need, when you need it. . You will need a follow up appointment in  6 months.  Please call our office 2 months in advance to schedule this appointment.  You may see Elouise Munroe, MD*or one of the following Advanced Practice Providers on your designated Care Team:   . Rosaria Ferries, PA-C . Jory Sims, DNP, ANP  Any Other Special Instructions Will Be Listed Below (If Applicable).    You have been referred to  [facility]

## 2019-03-18 ENCOUNTER — Telehealth: Payer: Self-pay | Admitting: *Deleted

## 2019-03-18 NOTE — Telephone Encounter (Signed)
-----   Message from Elouise Munroe, MD sent at 03/18/2019  7:23 AM EDT ----- Regarding: labs Pls call patient and let her know we need to get follow up labs based on her hospital dismissal, and we did not have a chance to talk about this during our visit yesterday. Please order CMP, TSH, and lipids - she may need a statin based on suboptimal LDL in March. Thanks, GA

## 2019-03-18 NOTE — Telephone Encounter (Signed)
SPOKE TO PATIENT SHE IS AWARE THAT LABS ARE NEEDED. SHE STATES SHE WILL DO ONCE SHE RECEIVE  INFORMATION IN THE MAIL

## 2019-03-18 NOTE — Addendum Note (Signed)
Addended by: Raiford Simmonds on: 03/18/2019 08:26 AM   Modules accepted: Orders

## 2019-03-23 ENCOUNTER — Other Ambulatory Visit: Payer: Self-pay

## 2019-03-23 ENCOUNTER — Ambulatory Visit (HOSPITAL_COMMUNITY)
Admission: RE | Admit: 2019-03-23 | Discharge: 2019-03-23 | Disposition: A | Discharge status: Home / Self Care | Payer: Medicare Other | Source: Ambulatory Visit | Attending: Physician Assistant | Admitting: Physician Assistant

## 2019-03-23 ENCOUNTER — Encounter (HOSPITAL_COMMUNITY): Payer: Self-pay | Admitting: Physician Assistant

## 2019-03-23 VITALS — BP 142/76 | HR 72 | Ht 66.5 in | Wt 172.0 lb

## 2019-03-23 DIAGNOSIS — E079 Disorder of thyroid, unspecified: Secondary | ICD-10-CM | POA: Diagnosis not present

## 2019-03-23 DIAGNOSIS — Z7901 Long term (current) use of anticoagulants: Secondary | ICD-10-CM | POA: Insufficient documentation

## 2019-03-23 DIAGNOSIS — Z822 Family history of deafness and hearing loss: Secondary | ICD-10-CM | POA: Insufficient documentation

## 2019-03-23 DIAGNOSIS — Z79890 Hormone replacement therapy (postmenopausal): Secondary | ICD-10-CM | POA: Insufficient documentation

## 2019-03-23 DIAGNOSIS — Z79899 Other long term (current) drug therapy: Secondary | ICD-10-CM | POA: Diagnosis not present

## 2019-03-23 DIAGNOSIS — I429 Cardiomyopathy, unspecified: Secondary | ICD-10-CM | POA: Insufficient documentation

## 2019-03-23 DIAGNOSIS — Z833 Family history of diabetes mellitus: Secondary | ICD-10-CM | POA: Diagnosis not present

## 2019-03-23 DIAGNOSIS — I4892 Unspecified atrial flutter: Secondary | ICD-10-CM | POA: Diagnosis not present

## 2019-03-23 NOTE — Progress Notes (Signed)
Primary Care Physician: Willow OraAndy, Camille L, MD Primary Cardiologist: Dr Jacques NavyAcharya Primary Electrophysiologist: none Referring Physician: Dr Mila HomerAcharya   Cathy Lambert is a 85 y.o. female with a history of hypothyroidism, migraines and atrial flutter who presents for [facility] in the [facility].  The patient was initially diagnosed with atrial flutter after presenting to the ER on 03/04/19 with symptoms of heart racing in the 140s Bpm. No other associated symptoms, but in hindsight she does note that she was more fatigued. There were no triggers that the patient could identify. She underwent TEE/DCCV after being loaded on IV amiodarone. She has done well since that time with no symptoms.   Today, she denies symptoms of palpitations, chest pain, shortness of breath, orthopnea, PND, lower extremity edema, dizziness, presyncope, syncope, snoring, daytime somnolence, bleeding, or neurologic sequela. The patient is tolerating medications without difficulties and is otherwise without complaint today.    Atrial Fibrillation Risk Factors:  she does not have symptoms or diagnosis of sleep apnea. she does not have a history of rheumatic fever. she does not have a history of alcohol use. The patient does not have a history of early familial atrial fibrillation or other arrhythmias.  she has a BMI of Body mass index is 27.35 kg/m.[name] Kitchen. Filed Weights   03/23/19 1345  Weight: 78 kg    Family History  Problem Relation Age of Onset  . Arthritis Mother   . Cancer Mother   . Cancer Sister   . Diabetes Brother   . Cancer Maternal Grandmother   . Stroke Maternal Grandfather   . Cancer Paternal Grandfather   . Hearing loss Sister   . Cancer Son      Atrial Fibrillation Management history:  Previous antiarrhythmic drugs: amiodarone  Previous cardioversions: 03/08/19 Previous ablations: none CHADS2VASC score: 3 Anticoagulation history: Eliquis   Past Medical History:   Diagnosis Date  . Migraines   . Primary osteoarthritis of knee   . Thyroid disease    Past Surgical History:  Procedure Laterality Date  . BUNIONECTOMY  1991  . CARDIOVERSION N/A 03/08/2019   Procedure: CARDIOVERSION;  Surgeon: Elease HashimotoNahser, Deloris PingPhilip J, MD;  [facility];  Service: Cardiovascular;  Laterality: N/A;  . CATARACT EXTRACTION  2008-2009  . TEE WITHOUT CARDIOVERSION N/A 03/08/2019   Procedure: TRANSESOPHAGEAL ECHOCARDIOGRAM (TEE);  Surgeon: Elease HashimotoNahser, Deloris PingPhilip J, MD;  [facility];  Service: Cardiovascular;  Laterality: N/A;  . TONSILLECTOMY AND ADENOIDECTOMY  1953    Current Outpatient Medications  Medication Sig Dispense Refill  . amiodarone (PACERONE) 200 MG tablet Take 1 tablet (200 mg total) by mouth daily. 30 tablet 2  . apixaban (ELIQUIS) 5 MG TABS tablet Take 1 tablet (5 mg total) by mouth 2 (two) times daily. 60 tablet 12  . B Complex Vitamins (VITAMIN B COMPLEX PO) Take 1 tablet by mouth daily with breakfast.     . Cholecalciferol (VITAMIN D-3 PO) Take 1 capsule by mouth daily with breakfast.    . levothyroxine (SYNTHROID, LEVOTHROID) 50 MCG tablet Take 100mg  on Tu and Th, 50mg  on all other days (Patient taking differently: Take 50-100 mcg by mouth See admin instructions. Take 50 mcg by mouth in the morning before breakfast on Sun/Mon/Wed/Fri/Sat and 100 mcg on Tues/Thurs) 120 tablet 3  . LORazepam (ATIVAN) 2 MG tablet TAKE 0.5 TABLETS (1 MG TOTAL) BY MOUTH AT BEDTIME AS NEEDED FOR SLEEP. 30 tablet 3  . Magnesium 500 MG TABS Take 500 mg by mouth at bedtime.    .[name] Kitchen  metoprolol tartrate (LOPRESSOR) 25 MG tablet Take 1 tablet (25 mg total) by mouth 2 (two) times daily. 60 tablet 3  . Probiotic Product (PROBIOTIC DAILY PO) Take 1 capsule by mouth daily.      No current facility-administered medications for this encounter.     No Known Allergies  Social History   Socioeconomic History  . Marital status: Divorced    Spouse name: Not on file  . Number of  children: Not on file  . Years of education: Not on file  . Highest education level: Not on file  Occupational History  . Not on file  Social Needs  . Financial resource strain: Not on file  . Food insecurity    Worry: Not on file    Inability: Not on file  . Transportation needs    Medical: Not on file    Non-medical: Not on file  Tobacco Use  . Smoking status: Never Smoker  . Smokeless tobacco: Never Used  Substance and Sexual Activity  . Alcohol use: No    Frequency: Never  . Drug use: No  . Sexual activity: Never    Birth control/protection: Post-menopausal  Lifestyle  . Physical activity    Days per week: Not on file    Minutes per session: Not on file  . Stress: Not on file  Relationships  . Social Musicianconnections    Talks on phone: Not on file    Gets together: Not on file    Attends religious service: Not on file    Active member of club or organization: Not on file    Attends meetings of clubs or organizations: Not on file    Relationship status: Not on file  . Intimate partner violence    Fear of current or ex partner: Not on file    Emotionally abused: Not on file    Physically abused: Not on file    Forced sexual activity: Not on file  Other Topics Concern  . Not on file  Social History Narrative  . Not on file     ROS- All systems are reviewed and negative except as per the HPI above.  Physical Exam: Vitals:   03/23/19 1345  Weight: 78 kg  Height: 5' 6.5" (1.689 m)    GEN- The patient is well appearing elderly female, alert and oriented x 3 today.   Head- normocephalic, atraumatic Eyes-  Sclera clear, conjunctiva pink Ears- hearing intact Oropharynx- clear Neck- supple  Lungs- Clear to ausculation bilaterally, normal work of breathing Heart- Regular rate and rhythm, no murmurs, rubs or gallops  GI- soft, NT, ND, + BS Extremities- no clubbing, cyanosis, or edema MS- no significant deformity or atrophy Skin- no rash or lesion Psych- euthymic  mood, full affect Neuro- strength and sensation are intact  Wt Readings from Last 3 Encounters:  03/23/19 78 kg  03/17/19 78.9 kg  03/09/19 79.2 kg    EKG today demonstrates SR with 1st degree AV block, LBBB, PR 216, QRS 134, QTc 479  TEE 03/08/19 1. No evidence of a thrombus present in the left atrial appendage.  2. The aortic valve is grossly normal Aortic valve regurgitation is mild by color flow Doppler.  3. The tricuspid valve was grossly normal.  4. The aortic root is normal in size and structure.  5. There is evidence of mild plaque in the descending aorta.  6. The left ventricle has normal systolic function, with an ejection fraction of 55-60%.  FINDINGS  Left Ventricle:  The left ventricle has normal systolic function, with an ejection fraction of 55-60%.  TEE documentation note mentioned EF 35-40%   Epic records are reviewed at length today   Assessment and Plan:  1. Atrial flutter S/p TEE/DCCV 03/08/19. Patient appears to be maintaining SR. General education about atrial arrhythmias provided and questions answered.  We also discussed her stroke risk and the risks and benefits of anticoagulation. Continue amiodarone 200 mg daily. May consider decreasing on follow up if she continues to do well. Continue metoprolol 25 mg BID Continue Eliquis 5 mg BID  This patients CHA2DS2-VASc Score and unadjusted Ischemic Stroke Rate (% per year) is equal to 3.2 % stroke rate/year from a score of 3  Above score calculated as 1 point each if present [CHF, HTN, DM, Vascular=MI/PAD/Aortic Plaque, Age if 65-74, or Female] Above score calculated as 2 points each if present [Age > 75, or Stroke/TIA/TE]  2. Cardiomyopathy Discrepancy in TEE documentation of EF. Possibly tachycardia mediated. Will recheck echo in 3 months. Continue BB   Follow up in AF clinic in 3 months. Dr Margaretann Loveless in 6 months.    [facility] [address]  [place], [place] [address] [phone] 03/23/2019 1:49 PM

## 2019-03-26 DIAGNOSIS — I4892 Unspecified atrial flutter: Secondary | ICD-10-CM | POA: Diagnosis not present

## 2019-03-26 DIAGNOSIS — E039 Hypothyroidism, unspecified: Secondary | ICD-10-CM | POA: Diagnosis not present

## 2019-03-26 DIAGNOSIS — E785 Hyperlipidemia, unspecified: Secondary | ICD-10-CM | POA: Diagnosis not present

## 2019-03-26 LAB — LIPID PANEL
Chol/HDL Ratio: 2.9 ratio (ref 0.0–4.4)
Cholesterol, Total: 220 mg/dL — ABNORMAL HIGH (ref 100–199)
HDL: 76 mg/dL (ref >39)
LDL Calculated: 127 mg/dL — ABNORMAL HIGH (ref 0–99)
Triglycerides: 86 mg/dL (ref 0–149)
VLDL Cholesterol Cal: 17 mg/dL (ref 5–40)

## 2019-03-26 LAB — TSH: TSH: 10.3 u[IU]/mL — ABNORMAL HIGH (ref 0.450–4.500)

## 2019-03-26 LAB — COMPREHENSIVE METABOLIC PANEL
ALT: 21 IU/L (ref 0–32)
AST: 22 IU/L (ref 0–40)
Albumin/Globulin Ratio: 2 (ref 1.2–2.2)
Albumin: 4.3 g/dL (ref 3.6–4.6)
Alkaline Phosphatase: 64 IU/L (ref 39–117)
BUN/Creatinine Ratio: 10 — ABNORMAL LOW (ref 12–28)
BUN: 13 mg/dL (ref 8–27)
Bilirubin Total: 0.5 mg/dL (ref 0.0–1.2)
CO2: 22 mmol/L (ref 20–29)
Calcium: 9.6 mg/dL (ref 8.7–10.3)
Chloride: 101 mmol/L (ref 96–106)
Creatinine, Ser: 1.29 mg/dL — ABNORMAL HIGH (ref 0.57–1.00)
GFR calc Af Amer: 44 mL/min/{1.73_m2} — ABNORMAL LOW (ref >59)
GFR calc non Af Amer: 38 mL/min/{1.73_m2} — ABNORMAL LOW (ref >59)
Globulin, Total: 2.2 g/dL (ref 1.5–4.5)
Glucose: 100 mg/dL — ABNORMAL HIGH (ref 65–99)
Potassium: 5 mmol/L (ref 3.5–5.2)
Sodium: 139 mmol/L (ref 134–144)
Total Protein: 6.5 g/dL (ref 6.0–8.5)

## 2019-03-31 ENCOUNTER — Other Ambulatory Visit (HOSPITAL_COMMUNITY): Payer: Self-pay | Admitting: *Deleted

## 2019-03-31 DIAGNOSIS — I4892 Unspecified atrial flutter: Secondary | ICD-10-CM

## 2019-03-31 MED ORDER — APIXABAN 5 MG PO TABS: 5.0000 mg | ORAL_TABLET | Freq: Two times a day (BID) | ORAL | 1 refills | Status: DC

## 2019-03-31 MED ORDER — AMIODARONE HCL 200 MG PO TABS: 200.0000 mg | ORAL_TABLET | Freq: Every day | ORAL | 1 refills | Status: DC

## 2019-03-31 MED ORDER — METOPROLOL TARTRATE 25 MG PO TABS: 25.0000 mg | ORAL_TABLET | Freq: Two times a day (BID) | ORAL | 1 refills | Status: DC

## 2019-04-09 ENCOUNTER — Telehealth: Payer: Self-pay | Admitting: Internal Medicine

## 2019-04-09 NOTE — Telephone Encounter (Signed)
Patient called stating she received a message yesterday through my chart advised her to stop taking amiodarone (PACERONE) 200 MG tablet. She was calling to let us know she has stopped taking it.

## 2019-04-09 NOTE — Telephone Encounter (Signed)
Per  Lab result notes  From Dr  Harrell Gave  ( Notes recorded by Buford Dresser, MD on 04/08/2019 at 11:08 AM EDT  Cholesterol numbers continue to go up, but the thyroid test is abnormal today, and this may be related. I will send your thyroid results on to Dr. Jonni Sanger. I would stop the amiodarone as well as this may be related to your thyroid changes. You do not need to taper this as it is a long acting medication and will wear off gradually. If you notice fast heart rhythms again please let us know.Christopher )  AMIODARONE DISCONTINUE ON MEDICATION LIST   PATIENT AWARE AND WILL CONTACT DR ANDY'S OFFICE NEXT WEEK.

## 2019-04-12 ENCOUNTER — Encounter: Payer: Self-pay | Admitting: Family Medicine

## 2019-04-13 ENCOUNTER — Telehealth: Payer: Self-pay | Admitting: *Deleted

## 2019-04-13 NOTE — Telephone Encounter (Signed)
-----   Message from Buford Dresser, MD sent at 04/08/2019 11:08 AM EDT ----- Cholesterol numbers continue to go up, but the thyroid test is abnormal today, and this may be related. I will send your thyroid results on to Dr. Jonni Sanger. I would stop the amiodarone as well as this may be related to your thyroid changes. You do not need to taper this as it is a long acting medication and will wear off gradually. If you notice fast heart rhythms again please let us know.

## 2019-04-13 NOTE — Telephone Encounter (Signed)
Followed up with patient, she reviewed message and d/c Amiodarone. She spoke with PCP and has lab recheck in about 6 weeks.

## 2019-05-06 ENCOUNTER — Other Ambulatory Visit: Payer: Self-pay | Admitting: Family Medicine

## 2019-05-10 ENCOUNTER — Encounter: Payer: Self-pay | Admitting: Family Medicine

## 2019-05-10 ENCOUNTER — Ambulatory Visit
Admission: RE | Admit: 2019-05-10 | Discharge: 2019-05-10 | Disposition: A | Discharge status: Home / Self Care | Payer: Medicare Other | Source: Ambulatory Visit | Attending: Family Medicine | Admitting: Family Medicine

## 2019-05-10 ENCOUNTER — Other Ambulatory Visit: Payer: Self-pay

## 2019-05-10 DIAGNOSIS — Z780 Asymptomatic menopausal state: Secondary | ICD-10-CM | POA: Diagnosis not present

## 2019-05-10 DIAGNOSIS — M810 Age-related osteoporosis without current pathological fracture: Secondary | ICD-10-CM

## 2019-05-10 DIAGNOSIS — M8580 Other specified disorders of bone density and structure, unspecified site: Secondary | ICD-10-CM

## 2019-05-10 DIAGNOSIS — Z1239 Encounter for other screening for malignant neoplasm of breast: Secondary | ICD-10-CM

## 2019-05-10 DIAGNOSIS — Z1231 Encounter for screening mammogram for malignant neoplasm of breast: Secondary | ICD-10-CM | POA: Diagnosis not present

## 2019-05-10 DIAGNOSIS — M8588 Other specified disorders of bone density and structure, other site: Secondary | ICD-10-CM | POA: Diagnosis not present

## 2019-05-10 HISTORY — DX: Age-related osteoporosis without current pathological fracture: M81.0

## 2019-05-20 ENCOUNTER — Ambulatory Visit: Payer: Medicare Other

## 2019-05-20 ENCOUNTER — Ambulatory Visit: Payer: Medicare Other | Admitting: Family Medicine

## 2019-05-25 ENCOUNTER — Encounter: Payer: Self-pay | Admitting: Family Medicine

## 2019-05-25 ENCOUNTER — Ambulatory Visit (INDEPENDENT_AMBULATORY_CARE_PROVIDER_SITE_OTHER): Payer: Medicare Other | Admitting: Family Medicine

## 2019-05-25 ENCOUNTER — Other Ambulatory Visit: Payer: Self-pay

## 2019-05-25 VITALS — BP 130/80 | HR 77 | Temp 97.9°F | Resp 16 | Ht 65.25 in | Wt 175.0 lb

## 2019-05-25 DIAGNOSIS — Z79899 Other long term (current) drug therapy: Secondary | ICD-10-CM | POA: Diagnosis not present

## 2019-05-25 DIAGNOSIS — M1711 Unilateral primary osteoarthritis, right knee: Secondary | ICD-10-CM | POA: Diagnosis not present

## 2019-05-25 DIAGNOSIS — E039 Hypothyroidism, unspecified: Secondary | ICD-10-CM

## 2019-05-25 DIAGNOSIS — Z7901 Long term (current) use of anticoagulants: Secondary | ICD-10-CM

## 2019-05-25 DIAGNOSIS — I482 Chronic atrial fibrillation, unspecified: Secondary | ICD-10-CM | POA: Diagnosis not present

## 2019-05-25 DIAGNOSIS — F5101 Primary insomnia: Secondary | ICD-10-CM

## 2019-05-25 LAB — TSH: TSH: 1.41 u[IU]/mL (ref 0.35–4.50)

## 2019-05-25 MED ORDER — SHINGRIX 50 MCG/0.5ML IM SUSR: 0.5000 mL | Freq: Once | INTRAMUSCULAR | 0 refills | Status: AC

## 2019-05-25 MED ORDER — LORAZEPAM 2 MG PO TABS: 1.0000 mg | ORAL_TABLET | Freq: Every evening | ORAL | 3 refills | Status: DC | PRN

## 2019-05-25 NOTE — Patient Instructions (Signed)
Please schedule your complete physical for march of next year.  I will let you know your thyroid results and when we need to check again.   Please take your thyroid medication as follows: [name] Kitchen Take every morning on an empty stomach . Do not take food or drink for at least 30 minutes . Do not take with PPI's (prilosec, protonix etc), Calcium, Iron or multivitamins; these need to be taken at least 4 hours apart from the thyroid medication.    If you have any questions or concerns, please don't hesitate to send me a message via MyChart or call the office at [phone]. Thank you for visiting with Cathy Lambert today! It's our pleasure caring for you.

## 2019-05-25 NOTE — Progress Notes (Signed)
Subjective  CC:  Chief Complaint  Patient presents with  . Hypothyroidism  . Atrial Fibrillation    HPI: Cathy Lambert is a 85 y.o. female who presents to the office today to address the problems listed above in the chief complaint.  F/u hypothyroidism: last tsh was high as noted below and was in part thought due to amiodarone use which has since been stopped.  She is here for follow-up for reassessment.  She has been off the medication for 2 months.  She takes her thyroid medicine as prescribed.  She feels her thyroid might still be persistently low.  She describes dry itchy skin and decreased energy level noted a sleepiness in the afternoon which is very atypical for her.  She denies shortness of breath, edema.  History of atrial fibrillation and wide-complex tachycardia status post cardioversion by cardiology.  Currently on a blood thinner without any cardiac symptoms.  Blood pressures been well controlled.  I reviewed her log with average 120s over 70s and heart rate in the 60s to 70s.  Primary osteoarthritis of the right knee and patellofemoral syndrome: She requesting a handicap placard due to pain with walking when she has osteoarthritic flares.  Primary insomnia on chronic benzos due for refill.  No adverse effects noted.  No recent falls.  She understands risk and benefits of use.  Has been on it chronically for over 2 decades. Lab Results  Component Value Date   TSH 10.300 (H) 03/26/2019    Assessment  1. Acquired hypothyroidism   2. Chronic atrial fibrillation (HCC)   3. Primary osteoarthritis of right knee   4. Chronic prescription benzodiazepine use   5. Primary insomnia   6. Current use of long term anticoagulation      Plan   Hypothyroidism: Recheck levels and re-dose as needed.  Clinically hypothyroid by symptoms today.  Osteoarthritis: Handicap placard application completed today.  A. fib: Well-controlled currently, has follow-up with cardiology.  Insomnia  chronic benzos, refilled.  Long-term anticoagulation  Prescription for Shingrix given, did not get last year due to backorder.  Flu shot is up-to-date.  Follow up:   10/25/2019  Orders Placed This Encounter  Procedures  . TSH   Meds ordered this encounter  Medications  . LORazepam (ATIVAN) 2 MG tablet    Sig: Take 0.5 tablets (1 mg total) by mouth at bedtime as needed for sleep.    Dispense:  30 tablet    Refill:  3    This request is for a new prescription for a controlled substance as required by Federal/State law..  . Zoster Vaccine Adjuvanted [facility]) injection    Sig: Inject 0.5 mLs into the muscle once for 1 dose. Please give 2nd dose 2-6 months after first dose    Dispense:  2 each    Refill:  0      I reviewed the patients updated PMH, FH, and SocHx.    Patient Active Problem List   Diagnosis Date Noted  . Osteoporosis 05/10/2019    Priority: High  . Atrial fibrillation (HCC)     Priority: High  . Wide-complex tachycardia (HCC) 03/04/2019    Priority: High  . Acquired hypothyroidism 10/27/2017    Priority: High  . Peripheral neuropathy, idiopathic 06/24/2018    Priority: Medium  . Primary osteoarthritis of knee     Priority: Medium  . Patellofemoral pain syndrome of right knee 06/24/2018    Priority: Low  . History of migraine - none since age  37 10/27/2017    Priority: Low  . Primary insomnia 05/25/2019  . Chronic prescription benzodiazepine use 05/25/2019  . Current use of long term anticoagulation 05/25/2019   Current Meds  Medication Sig  . apixaban (ELIQUIS) 5 MG TABS tablet Take 1 tablet (5 mg total) by mouth 2 (two) times daily.  . B Complex Vitamins (VITAMIN B COMPLEX PO) Take 1 tablet by mouth daily with breakfast.   . Cholecalciferol (VITAMIN D-3 PO) Take 1 capsule by mouth daily with breakfast.  . levothyroxine (SYNTHROID) 50 MCG tablet TAKE 1 TABLET BY MOUTH IN THE MORNING BEFORE BREAKFAST ON SUNDAY/MONDAY/WEDNESDAY/FRIDAY/SATURDAY AND 2  TABLETS ON TUESDAYS AND THURSDAYS  . LORazepam (ATIVAN) 2 MG tablet Take 0.5 tablets (1 mg total) by mouth at bedtime as needed for sleep.  . Magnesium 500 MG TABS Take 500 mg by mouth at bedtime.  . metoprolol tartrate (LOPRESSOR) 25 MG tablet Take 1 tablet (25 mg total) by mouth 2 (two) times daily.  . [DISCONTINUED] LORazepam (ATIVAN) 2 MG tablet TAKE 0.5 TABLETS (1 MG TOTAL) BY MOUTH AT BEDTIME AS NEEDED FOR SLEEP.    Allergies: Patient has No Known Allergies. Family History: Patient family history includes Arthritis in her mother; Cancer in her maternal grandmother, mother, paternal grandfather, sister, and son; Diabetes in her brother; Hearing loss in her sister; Stroke in her maternal grandfather. Social History:  Patient  reports that she has never smoked. She has never used smokeless tobacco. She reports that she does not drink alcohol or use drugs.  Review of Systems: Constitutional: Negative for fever malaise or anorexia Cardiovascular: negative for chest pain Respiratory: negative for SOB or persistent cough Gastrointestinal: negative for abdominal pain  Objective  Vitals: BP 130/80   Pulse 77   Temp 97.9 F (36.6 C) (Tympanic)   Resp 16   Ht 5' 5.25" (1.657 m)   Wt 175 lb (79.4 kg)   SpO2 98%   BMI 28.90 kg/m  General: no acute distress , A&Ox3, appears well HEENT: PEERL, conjunctiva normal, no lid lag, oropharynx moist,neck is supple Cardiovascular:  RRR without murmur or gallop.  No edema Respiratory:  Good breath sounds bilaterally, CTAB with normal respiratory effort Skin:  Warm, no rashes     Commons side effects, risks, benefits, and alternatives for medications and treatment plan prescribed today were discussed, and the patient expressed understanding of the given instructions. Patient is instructed to call or message via MyChart if he/she has any questions or concerns regarding our treatment plan. No barriers to understanding were identified. We discussed  Red Flag symptoms and signs in detail. Patient expressed understanding regarding what to do in case of urgent or emergency type symptoms.   Medication list was reconciled, printed and provided to the patient in AVS. Patient instructions and summary information was reviewed with the patient as documented in the AVS. This note was prepared with assistance of Dragon voice recognition software. Occasional wrong-word or sound-a-like substitutions may have occurred due to the inherent limitations of voice recognition software

## 2019-05-26 ENCOUNTER — Other Ambulatory Visit: Payer: Self-pay | Admitting: *Deleted

## 2019-05-26 MED ORDER — LEVOTHYROXINE SODIUM 50 MCG PO TABS: ORAL_TABLET | ORAL | 2 refills | Status: DC

## 2019-05-26 NOTE — Progress Notes (Signed)
Please call patient: I have reviewed his/her lab results. Thyroid level is perfect. Her fatigue or sleepiness is not due to her thyroid. Please continue her thyroid medication at the same dose. May refill if needed.

## 2019-06-15 ENCOUNTER — Encounter (HOSPITAL_COMMUNITY): Payer: Self-pay | Admitting: Nurse Practitioner

## 2019-06-15 ENCOUNTER — Other Ambulatory Visit: Payer: Self-pay

## 2019-06-15 ENCOUNTER — Ambulatory Visit (HOSPITAL_COMMUNITY)
Admission: RE | Admit: 2019-06-15 | Discharge: 2019-06-15 | Disposition: A | Discharge status: Home / Self Care | Payer: Medicare Other | Source: Ambulatory Visit | Attending: Physician Assistant | Admitting: Physician Assistant

## 2019-06-15 ENCOUNTER — Other Ambulatory Visit: Payer: Self-pay | Admitting: Internal Medicine

## 2019-06-15 VITALS — BP 180/100 | HR 69 | Ht 63.0 in | Wt 175.4 lb

## 2019-06-15 DIAGNOSIS — I4892 Unspecified atrial flutter: Secondary | ICD-10-CM | POA: Diagnosis not present

## 2019-06-15 DIAGNOSIS — M1710 Unilateral primary osteoarthritis, unspecified knee: Secondary | ICD-10-CM | POA: Insufficient documentation

## 2019-06-15 DIAGNOSIS — E039 Hypothyroidism, unspecified: Secondary | ICD-10-CM | POA: Insufficient documentation

## 2019-06-15 DIAGNOSIS — Z79899 Other long term (current) drug therapy: Secondary | ICD-10-CM | POA: Insufficient documentation

## 2019-06-15 DIAGNOSIS — Z7901 Long term (current) use of anticoagulants: Secondary | ICD-10-CM | POA: Diagnosis not present

## 2019-06-15 DIAGNOSIS — I480 Paroxysmal atrial fibrillation: Secondary | ICD-10-CM

## 2019-06-15 DIAGNOSIS — I429 Cardiomyopathy, unspecified: Secondary | ICD-10-CM | POA: Insufficient documentation

## 2019-06-15 DIAGNOSIS — I4891 Unspecified atrial fibrillation: Secondary | ICD-10-CM | POA: Insufficient documentation

## 2019-06-15 MED ORDER — METOPROLOL TARTRATE 25 MG PO TABS: ORAL_TABLET | ORAL | 1 refills | Status: DC

## 2019-06-15 MED ORDER — METOPROLOL SUCCINATE ER 25 MG PO TB24: 25.0000 mg | ORAL_TABLET | Freq: Every day | ORAL | 6 refills | Status: DC

## 2019-06-15 NOTE — Patient Instructions (Signed)
Stop metoprolol tartrate daily -- use for afib 1/2 tablet every 6 hours as needed for heart rates over 100.   Start Metoprolol succinate 25mg  once a day at bedtime.

## 2019-06-16 ENCOUNTER — Encounter (HOSPITAL_COMMUNITY): Payer: Self-pay | Admitting: Nurse Practitioner

## 2019-06-16 NOTE — Progress Notes (Signed)
Primary Care Physician: Leamon Arnt, MD Primary Cardiologist: Dr Margaretann Loveless Primary Electrophysiologist: none Referring Physician: Dr Melodye Ped is a 85 y.o. female with a history of hypothyroidism, migraines and atrial flutter who presents for consultation in the [facility].  The patient was initially diagnosed with atrial flutter after presenting to the ER on 03/04/19 with symptoms of heart racing in the 140s Bpm. No other associated symptoms, but in hindsight she does note that she was more fatigued. There were no triggers that the patient could identify. She underwent TEE/DCCV after being loaded on IV amiodarone. She has done well since that time with no symptoms.   F/u in afib clinic 10/27. She has not had any afib. She stopped amiodarone right after last visit here for change in thyroid panel. She has been able to maintain SR without it. She feels well. She is c/o fatigue, she thinks it is form lopressor.   Today, she denies symptoms of palpitations, chest pain, shortness of breath, orthopnea, PND, lower extremity edema, dizziness, presyncope, syncope, snoring, daytime somnolence, bleeding, or neurologic sequela. The patient is tolerating medications without difficulties and is otherwise without complaint today.    Atrial Fibrillation Risk Factors:  she does not have symptoms or diagnosis of sleep apnea. she does not have a history of rheumatic fever. she does not have a history of alcohol use. The patient does not have a history of early familial atrial fibrillation or other arrhythmias.  she has a BMI of Body mass index is 31.07 kg/m.[name] Kitchen Filed Weights   06/15/19 1140  Weight: 79.6 kg    Family History  Problem Relation Age of Onset  . Arthritis Mother   . Cancer Mother   . Cancer Sister   . Diabetes Brother   . Cancer Maternal Grandmother   . Stroke Maternal Grandfather   . Cancer Paternal Grandfather   . Hearing loss Sister    . Cancer Son      Atrial Fibrillation Management history:  Previous antiarrhythmic drugs: amiodarone  Previous cardioversions: 03/08/19 Previous ablations: none CHADS2VASC score: 3 Anticoagulation history: Eliquis   Past Medical History:  Diagnosis Date  . Atrial fibrillation ([place])   . History of migraine - none since age 3 10/27/2017  . Osteoporosis 05/10/2019   dexa 04/2019, lowest T = -2.6 at hip. To discuss 10/20 and offer tx.  . Primary osteoarthritis of knee   . Thyroid disease    Past Surgical History:  Procedure Laterality Date  . BUNIONECTOMY  1991  . CARDIOVERSION N/A 03/08/2019   Procedure: CARDIOVERSION;  Surgeon: Acie Fredrickson Wonda Cheng, MD;  Location: [facility] ENDOSCOPY;  Service: Cardiovascular;  Laterality: N/A;  . CATARACT EXTRACTION  2008-2009  . TEE WITHOUT CARDIOVERSION N/A 03/08/2019   Procedure: TRANSESOPHAGEAL ECHOCARDIOGRAM (TEE);  Surgeon: Acie Fredrickson Wonda Cheng, MD;  Location: [facility];  Service: Cardiovascular;  Laterality: N/A;  . TONSILLECTOMY AND ADENOIDECTOMY  1953    Current Outpatient Medications  Medication Sig Dispense Refill  . apixaban (ELIQUIS) 5 MG TABS tablet Take 1 tablet (5 mg total) by mouth 2 (two) times daily. 180 tablet 1  . B Complex Vitamins (VITAMIN B COMPLEX PO) Take 1 tablet by mouth daily with breakfast.     . Cholecalciferol (VITAMIN D-3 PO) Take 1 capsule by mouth daily with breakfast.    . levothyroxine (SYNTHROID) 50 MCG tablet TAKE 1 TABLET BY MOUTH IN THE MORNING BEFORE BREAKFAST ON SUNDAY/MONDAY/WEDNESDAY/FRIDAY/SATURDAY AND 2 TABLETS ON TUESDAYS AND  THURSDAYS 450 tablet 2  . LORazepam (ATIVAN) 2 MG tablet Take 0.5 tablets (1 mg total) by mouth at bedtime as needed for sleep. 30 tablet 3  . Magnesium 500 MG TABS Take 500 mg by mouth at bedtime.    . metoprolol tartrate (LOPRESSOR) 25 MG tablet Take 1/2 tablet every 6 hours as needed for HR over 100. 180 tablet 1  . Probiotic Product (PROBIOTIC DAILY PO) Take 1 capsule by mouth daily.      . metoprolol succinate (TOPROL XL) 25 MG 24 hr tablet Take 1 tablet (25 mg total) by mouth at bedtime. 30 tablet 6   No current facility-administered medications for this encounter.     No Known Allergies  Social History   Socioeconomic History  . Marital status: Divorced    Spouse name: Not on file  . Number of children: Not on file  . Years of education: Not on file  . Highest education level: Not on file  Occupational History  . Not on file  Social Needs  . Financial resource strain: Not on file  . Food insecurity    Worry: Not on file    Inability: Not on file  . Transportation needs    Medical: Not on file    Non-medical: Not on file  Tobacco Use  . Smoking status: Never Smoker  . Smokeless tobacco: Never Used  Substance and Sexual Activity  . Alcohol use: No    Frequency: Never  . Drug use: No  . Sexual activity: Never    Birth control/protection: Post-menopausal  Lifestyle  . Physical activity    Days per week: Not on file    Minutes per session: Not on file  . Stress: Not on file  Relationships  . Social Musician on phone: Not on file    Gets together: Not on file    Attends religious service: Not on file    Active member of club or organization: Not on file    Attends meetings of clubs or organizations: Not on file    Relationship status: Not on file  . Intimate partner violence    Fear of current or ex partner: Not on file    Emotionally abused: Not on file    Physically abused: Not on file    Forced sexual activity: Not on file  Other Topics Concern  . Not on file  Social History Narrative  . Not on file     ROS- All systems are reviewed and negative except as per the HPI above.  Physical Exam: Vitals:   06/15/19 1140  BP: (!) 180/100  Pulse: 69  Weight: 79.6 kg  Height: 5\' 3"  (1.6 m)    GEN- The patient is well appearing elderly female, alert and oriented x 3 today.   Head- normocephalic, atraumatic Eyes-  Sclera  clear, conjunctiva pink Ears- hearing intact Oropharynx- clear Neck- supple  Lungs- Clear to ausculation bilaterally, normal work of breathing Heart- Regular rate and rhythm, no murmurs, rubs or gallops  GI- soft, NT, ND, + BS Extremities- no clubbing, cyanosis, or edema MS- no significant deformity or atrophy Skin- no rash or lesion Psych- euthymic mood, full affect Neuro- strength and sensation are intact  Wt Readings from Last 3 Encounters:  06/15/19 79.6 kg  05/25/19 79.4 kg  03/23/19 78 kg    EKG today demonstrates SR with 1st degree AV block, LBBB, PR 216, QRS 134, QTc 479  TEE 03/08/19 1. No evidence of  a thrombus present in the left atrial appendage.  2. The aortic valve is grossly normal Aortic valve regurgitation is mild by color flow Doppler.  3. The tricuspid valve was grossly normal.  4. The aortic root is normal in size and structure.  5. There is evidence of mild plaque in the descending aorta.  6. The left ventricle has normal systolic function, with an ejection fraction of 55-60%.  FINDINGS  Left Ventricle: The left ventricle has normal systolic function, with an ejection fraction of 55-60%.  TEE documentation note mentioned EF 35-40%   Epic records are reviewed at length today   Assessment and Plan:  1. Atrial flutter S/p TEE/DCCV 03/08/19. Patient appears to be maintaining SR. General education about atrial arrhythmias provided and questions answered.   she is now off amiodarone for changes in thyroid panel Will change metoprolol tartrate to succinate 25 mg at hs to offset fatigue  Continue Eliquis 5 mg BID  This patients CHA2DS2-VASc Score and unadjusted Ischemic Stroke Rate (% per year) is equal to 3.2 % stroke rate/year from a score of 3  Above score calculated as 1 point each if present [CHF, HTN, DM, Vascular=MI/PAD/Aortic Plaque, Age if 65-74, or Female] Above score calculated as 2 points each if present [Age > 75, or Stroke/TIA/TE]  2.  Cardiomyopathy Discrepancy in TEE documentation of EF. Possibly tachycardia mediated. Recheck echo  Continue BB    f/u in afib clinic 6 months   Jorja Loaicky Fenton PA-C [facility] [address] [place], [place] [address] [phone] 06/16/2019 8:27 AM

## 2019-06-17 ENCOUNTER — Other Ambulatory Visit: Payer: Self-pay

## 2019-06-18 ENCOUNTER — Other Ambulatory Visit: Payer: Self-pay

## 2019-06-21 ENCOUNTER — Telehealth: Payer: Self-pay | Admitting: Family Medicine

## 2019-06-21 ENCOUNTER — Other Ambulatory Visit: Payer: Self-pay

## 2019-06-21 NOTE — Telephone Encounter (Signed)
The selected provider has not submitted site data related to at least one of the procedures ordered on this case. Order Request Summary  Order ID: [id number] Request Status:  Authorized Health Plan: [employer]   Valid Dates: 06/21/2019 - 12/17/2019 Scheduled Date of Service: 06/22/2019   Member Information: Keitha Butte Member #: [id number] [address] [place] , [place] [id number] Date of Birth: 11-14-1930 Phone: [phone] Ordering Provider: Delma Freeze [address] [place] , [place] [id number] Phone: [phone] Fax: [phone] NPI: [id number] Servicing Provider:  [facility] [address] [place] , [place] [address] Phone: [phone] Fax: [phone] NPI: [id number]

## 2019-06-22 ENCOUNTER — Ambulatory Visit (HOSPITAL_COMMUNITY)
Admission: RE | Admit: 2019-06-22 | Discharge: 2019-06-22 | Disposition: A | Discharge status: Home / Self Care | Payer: Medicare Other | Source: Ambulatory Visit | Attending: Family Medicine | Admitting: Family Medicine

## 2019-06-22 ENCOUNTER — Other Ambulatory Visit: Payer: Self-pay

## 2019-06-22 DIAGNOSIS — I480 Paroxysmal atrial fibrillation: Secondary | ICD-10-CM | POA: Diagnosis not present

## 2019-06-22 DIAGNOSIS — I082 Rheumatic disorders of both aortic and tricuspid valves: Secondary | ICD-10-CM | POA: Insufficient documentation

## 2019-06-22 NOTE — Progress Notes (Signed)
Echocardiogram 2D Echocardiogram has been performed.  Oneal Deputy Marjani Kobel 06/22/2019, 1:50 PM

## 2019-06-24 ENCOUNTER — Encounter (HOSPITAL_COMMUNITY): Payer: Self-pay | Admitting: *Deleted

## 2019-07-07 DIAGNOSIS — Z0120 Encounter for dental examination and cleaning without abnormal findings: Secondary | ICD-10-CM | POA: Diagnosis not present

## 2019-07-20 ENCOUNTER — Telehealth: Payer: Self-pay | Admitting: Internal Medicine

## 2019-07-20 NOTE — Telephone Encounter (Signed)
Sent via epic fax for information and records for patients appointment tomorrow-

## 2019-07-20 NOTE — Telephone Encounter (Signed)
Patient returned call

## 2019-07-20 NOTE — Telephone Encounter (Signed)
Message sent to [place] for advice.

## 2019-07-20 NOTE — Telephone Encounter (Signed)
McKenzie from [employer] is calling to request instructions regarding the patients Eliquis for dental appointment on 07/21/19. Fax # [phone]

## 2019-07-20 NOTE — Telephone Encounter (Signed)
Patient has a dental cleaning and xrays tomorrow. Dr. Alonna Buckler office is calling to make sure that the patient can have this done beings that she is on Eliquis. Please advise before her appt tomorrow.

## 2019-07-20 NOTE — Telephone Encounter (Signed)
If she is just having a routine cleaning, there is no need to stop the Eliquis. Please have them contact us if she is having more complex dental work and we can discuss. Thanks, GA

## 2019-07-21 DIAGNOSIS — Z0120 Encounter for dental examination and cleaning without abnormal findings: Secondary | ICD-10-CM | POA: Diagnosis not present

## 2019-09-08 ENCOUNTER — Ambulatory Visit: Payer: Medicare Other | Attending: Internal Medicine

## 2019-09-08 DIAGNOSIS — Z23 Encounter for immunization: Secondary | ICD-10-CM | POA: Insufficient documentation

## 2019-09-08 NOTE — Progress Notes (Signed)
   Covid-19 Vaccination Clinic  Name:  Cathy Lambert    MRN: [id number] DOB: [birth year]  09/08/2019  Cathy Lambert was observed post Covid-19 immunization for 15 minutes without incidence. She was provided with Vaccine Information Sheet and instruction to access the V-Safe system.   Cathy Lambert was instructed to call 911 with any severe reactions post vaccine: [name] Kitchen Difficulty breathing  . Swelling of your face and throat  . A fast heartbeat  . A bad rash all over your body  . Dizziness and weakness    Immunizations Administered    Name Date Dose VIS Date Route   Pfizer COVID-19 Vaccine 09/08/2019  8:36 AM 0.3 mL 07/30/2019 Intramuscular   Manufacturer: [employer], [employer]   Lot: [id number]   NDC: [address]-6

## 2019-09-13 ENCOUNTER — Ambulatory Visit: Payer: Medicare Other | Admitting: Internal Medicine

## 2019-09-13 ENCOUNTER — Other Ambulatory Visit: Payer: Self-pay

## 2019-09-13 ENCOUNTER — Encounter: Payer: Self-pay | Admitting: Internal Medicine

## 2019-09-13 VITALS — BP 146/90 | HR 73 | Temp 97.0°F | Ht 66.0 in | Wt 178.6 lb

## 2019-09-13 DIAGNOSIS — Z7901 Long term (current) use of anticoagulants: Secondary | ICD-10-CM | POA: Diagnosis not present

## 2019-09-13 DIAGNOSIS — I4892 Unspecified atrial flutter: Secondary | ICD-10-CM

## 2019-09-13 DIAGNOSIS — E039 Hypothyroidism, unspecified: Secondary | ICD-10-CM

## 2019-09-13 DIAGNOSIS — E785 Hyperlipidemia, unspecified: Secondary | ICD-10-CM

## 2019-09-13 NOTE — Progress Notes (Signed)
Cardiology Office Note:    Date:  09/13/2019   ID:  Cathy Lambert, Cathy Lambert 1930-10-23, MRN [id number]  PCP:  Willow Ora, MD  Cardiologist:  Parke Poisson, MD  Electrophysiologist:  None   Referring MD: Willow Ora, MD   Chief Complaint: f/u atrial flutter  History of Present Illness:    Cathy Lambert is a 86 y.o. female with a hx of hypothyroidism and migraines who was admitted for to the hospital in July 2020 for tachycardia and fatigue found to have new onset atrial flutter with RVR, requiring TEE cardioversion as well as metoprolol and amiodarone. Doing well, feels well, no complaints. She has since been seen in the atrial fibrillation clinic to discuss her atrial flutter further, so that all resources were available for her education on the subject.  Amiodarone stopped after August visit due to abnormalities in thyoid function. Maintaining sinus rhythm. Lopressor changed to Toprol to help combat fatigue. She feels well today.   Continues on eliquis 5 mg Bid without bleeding complication.  Ef normalized in sinus rhythm.   We discussed that she has an occasional abdominal sensation but denies chest pain, shortness of breath, palpitations, fatigue, syncope.  We discussed in great detail her medications, and again reviewed the natural history of atrial fibrillation and atrial flutter.  I have answered lifestyle questions the best my ability.  Red flag symptoms for presentation to the ER were reviewed again.  Past Medical History:  Diagnosis Date  . Atrial fibrillation (HCC)   . History of migraine - none since age 30 10/27/2017  . Osteoporosis 05/10/2019   dexa 04/2019, lowest T = -2.6 at hip. To discuss 10/20 and offer tx.  . Primary osteoarthritis of knee   . Thyroid disease     Past Surgical History:  Procedure Laterality Date  . BUNIONECTOMY  1991  . CARDIOVERSION N/A 03/08/2019   Procedure: CARDIOVERSION;  Surgeon: Elease Hashimoto Deloris Ping, MD;  Location: [facility] ENDOSCOPY;   Service: Cardiovascular;  Laterality: N/A;  . CATARACT EXTRACTION  2008-2009  . TEE WITHOUT CARDIOVERSION N/A 03/08/2019   Procedure: TRANSESOPHAGEAL ECHOCARDIOGRAM (TEE);  Surgeon: Elease Hashimoto Deloris Ping, MD;  [facility] ENDOSCOPY;  Service: Cardiovascular;  Laterality: N/A;  . TONSILLECTOMY AND ADENOIDECTOMY  1953    Current Medications: Current Meds  Medication Sig  . B Complex Vitamins (VITAMIN B COMPLEX PO) Take 1 tablet by mouth daily with breakfast.   . Cholecalciferol (VITAMIN D-3 PO) Take 1 capsule by mouth daily with breakfast.  . levothyroxine (SYNTHROID) 50 MCG tablet TAKE 1 TABLET BY MOUTH IN THE MORNING BEFORE BREAKFAST ON SUNDAY/MONDAY/WEDNESDAY/FRIDAY/SATURDAY AND 2 TABLETS ON TUESDAYS AND THURSDAYS  . LORazepam (ATIVAN) 2 MG tablet Take 0.5 tablets (1 mg total) by mouth at bedtime as needed for sleep.  . Magnesium 500 MG TABS Take 500 mg by mouth at bedtime.  . metoprolol succinate (TOPROL XL) 25 MG 24 hr tablet Take 1 tablet (25 mg total) by mouth at bedtime.  . metoprolol tartrate (LOPRESSOR) 25 MG tablet Take 1/2 tablet every 6 hours as needed for HR over 100.  . Probiotic Product (PROBIOTIC DAILY PO) Take 1 capsule by mouth daily.   . [DISCONTINUED] apixaban (ELIQUIS) 5 MG TABS tablet Take 1 tablet (5 mg total) by mouth 2 (two) times daily.     Allergies:   Patient has no known allergies.   Social History   Socioeconomic History  . Marital status: Divorced    Spouse name: Not on file  .  Number of children: Not on file  . Years of education: Not on file  . Highest education level: Not on file  Occupational History  . Not on file  Tobacco Use  . Smoking status: Never Smoker  . Smokeless tobacco: Never Used  Substance and Sexual Activity  . Alcohol use: No  . Drug use: No  . Sexual activity: Never    Birth control/protection: Post-menopausal  Other Topics Concern  . Not on file  Social History Narrative  . Not on file   Social Determinants of Health    Financial Resource Strain:   . Difficulty of Paying Living Expenses: Not on file  Food Insecurity:   . Worried About Programme researcher, broadcasting/film/video in the Last Year: Not on file  . Ran Out of Food in the Last Year: Not on file  Transportation Needs:   . Lack of Transportation (Medical): Not on file  . Lack of Transportation (Non-Medical): Not on file  Physical Activity:   . Days of Exercise per Week: Not on file  . Minutes of Exercise per Session: Not on file  Stress:   . Feeling of Stress : Not on file  Social Connections:   . Frequency of Communication with Friends and Family: Not on file  . Frequency of Social Gatherings with Friends and Family: Not on file  . Attends Religious Services: Not on file  . Active Member of Clubs or Organizations: Not on file  . Attends Banker Meetings: Not on file  . Marital Status: Not on file     Family History: The patient's family history includes Arthritis in her mother; Cancer in her maternal grandmother, mother, paternal grandfather, sister, and son; Diabetes in her brother; Hearing loss in her sister; Stroke in her maternal grandfather.  ROS:   Please see the history of present illness.    All other systems reviewed and are negative.  EKGs/Labs/Other Studies Reviewed:    The following studies were reviewed today:  EKG:  sr 1st deg av block rate 73  Recent Labs: 03/07/2019: Hemoglobin 13.5; Platelets 245 03/09/2019: Magnesium 2.3 03/26/2019: ALT 21; BUN 13; Creatinine, Ser 1.29; Potassium 5.0; Sodium 139 05/25/2019: TSH 1.41  Recent Lipid Panel    Component Value Date/Time   CHOL 220 (H) 03/26/2019 0906   TRIG 86 03/26/2019 0906   HDL 76 03/26/2019 0906   CHOLHDL 2.9 03/26/2019 0906   CHOLHDL 3 10/21/2018 1027   VLDL 14.2 10/21/2018 1027   LDLCALC 127 (H) 03/26/2019 0906    Physical Exam:    VS:  BP (!) 146/90   Pulse 73   Temp (!) 97 F (36.1 C)   Ht 5\' 6"  (1.676 m)   Wt 178 lb 9.6 oz (81 kg)   SpO2 97%   BMI  28.83 kg/m     Wt Readings from Last 5 Encounters:  09/13/19 178 lb 9.6 oz (81 kg)  06/15/19 175 lb 6.4 oz (79.6 kg)  05/25/19 175 lb (79.4 kg)  03/23/19 172 lb (78 kg)  03/17/19 174 lb (78.9 kg)     Constitutional: No acute distress Eyes: sclera non-icteric, normal conjunctiva and lids ENMT: normal dentition, moist mucous membranes Cardiovascular: regular rhythm, normal rate, no murmurs. S1 and S2 normal. Radial pulses normal bilaterally. No jugular venous distention.  Respiratory: clear to auscultation bilaterally GI : normal bowel sounds, soft and nontender. No distention.   MSK: extremities warm, well perfused. No edema.  NEURO: grossly nonfocal exam, moves all extremities. PSYCH:  alert and oriented x 3, normal mood and affect.   ASSESSMENT:    1. Atrial flutter, unspecified type ([place])   2. Acquired hypothyroidism   3. Hyperlipidemia, unspecified hyperlipidemia type   4. Long term (current) use of anticoagulants    PLAN:    AF -maintaining sinus rhythm.  On Toprol-XL 25 mg daily for rate control strategy.  Amiodarone stopped due to thyroid lab abnormalities.  Continues on anticoagulation with Eliquis 5 mg twice daily.  No bleeding complications.  No palpitations.  Patient feels well.  Acquired hypothyroidism-amiodarone has been stopped.  TSH has normalized off of amiodarone.  Hyperlipidemia - she has made diet and lifestyle changes.  We will need to discuss statin therapy for further optimization of abnormal lipid panel obtained in August.  LDL is not currently optimized.  Long-term use of anticoagulants-patient continues on Eliquis without bleeding event.  Continue Eliquis.  This patients CHA2DS2-VASc Score and unadjusted Ischemic Stroke Rate (% per year) is equal to 3.2 % stroke rate/year from a score of 3 Above score calculated as 1 point each if present [CHF, HTN, DM, Vascular=MI/PAD/Aortic Plaque, Age if 65-74, or Female] Above score calculated as 2 points each if  present [Age > 75, or Stroke/TIA/TE]  Total time of encounter: 40 minutes total time of encounter, including 30 minutes spent in face-to-face patient care. This time includes coordination of care and counseling regarding above issues. Remainder of non-face-to-face time involved reviewing chart documents/testing relevant to the patient encounter and documentation in the medical record.  Cherlynn Kaiser, MD [place]  [facility]   Medication Adjustments/Labs and Tests Ordered: Current medicines are reviewed at length with the patient today.  Concerns regarding medicines are outlined above.  Orders Placed This Encounter  Procedures  . EKG 12-Lead   No orders of the defined types were placed in this encounter.   Patient Instructions  Medication Instructions:  No changes  *If you need a refill on your cardiac medications before your next appointment, please call your pharmacy*  Lab Work: Not needed  Testing/Procedures: Not needed  Follow-Up: At [facility], you and your health needs are our priority.  As part of our continuing mission to provide you with exceptional heart care, we have created designated Provider Care Teams.  These Care Teams include your primary Cardiologist (physician) and Advanced Practice Providers (APPs -  Physician Assistants and Nurse Practitioners) who all work together to provide you with the care you need, when you need it.  Your next appointment:   6 month(s)  The format for your next appointment:   In Person  Provider:   Cherlynn Kaiser, MD  Other Instructions  COVID-19 Vaccine Information can be found at: [email or web address] For questions related to vaccine distribution or appointments, please email [email or web address]@ [email or web address] or call [phone].   or  How can I schedule a COVID-19 vaccine appointment? Online at [email or web address]. This is the fastest way to schedule. By phone  at [phone] (Option 2) from 8:00 am-5:00 pm, until all appointments have been filled.

## 2019-09-13 NOTE — Patient Instructions (Signed)
Medication Instructions:  No changes  *If you need a refill on your cardiac medications before your next appointment, please call your pharmacy*  Lab Work: Not needed  Testing/Procedures: Not needed  Follow-Up: At [facility], you and your health needs are our priority.  As part of our continuing mission to provide you with exceptional heart care, we have created designated Provider Care Teams.  These Care Teams include your primary Cardiologist (physician) and Advanced Practice Providers (APPs -  Physician Assistants and Nurse Practitioners) who all work together to provide you with the care you need, when you need it.  Your next appointment:   6 month(s)  The format for your next appointment:   In Person  Provider:   Weston Brass, MD  Other Instructions  COVID-19 Vaccine Information can be found at: [email or web address] For questions related to vaccine distribution or appointments, please email [email or web address] or call [phone].   or  How can I schedule a COVID-19 vaccine appointment? Online at [email or web address]. This is the fastest way to schedule. By phone at [phone] (Option 2) from 8:00 am-5:00 pm, until all appointments have been filled.

## 2019-09-24 ENCOUNTER — Other Ambulatory Visit (HOSPITAL_COMMUNITY): Payer: Self-pay | Admitting: Physician Assistant

## 2019-09-26 DIAGNOSIS — I4892 Unspecified atrial flutter: Secondary | ICD-10-CM | POA: Insufficient documentation

## 2019-09-27 ENCOUNTER — Ambulatory Visit: Payer: Medicare Other | Attending: Internal Medicine

## 2019-09-27 DIAGNOSIS — Z23 Encounter for immunization: Secondary | ICD-10-CM

## 2019-09-27 NOTE — Progress Notes (Signed)
   Covid-19 Vaccination Clinic  Name:  Cathy Lambert    MRN: [id number] DOB: Dec 01, 1930  09/27/2019  Ms. Cathy Lambert was observed post Covid-19 immunization for 15 minutes without incidence. She was provided with Vaccine Information Sheet and instruction to access the V-Safe system.   Ms. Cathy Lambert was instructed to call 911 with any severe reactions post vaccine: [name] Kitchen Difficulty breathing  . Swelling of your face and throat  . A fast heartbeat  . A bad rash all over your body  . Dizziness and weakness    Immunizations Administered    Name Date Dose VIS Date Route   Pfizer COVID-19 Vaccine 09/27/2019 11:08 AM 0.3 mL 07/30/2019 Intramuscular   Manufacturer: [employer], [employer]   Lot: [id number]   NDC: [address]-3

## 2019-10-19 ENCOUNTER — Telehealth: Payer: Self-pay | Admitting: Family Medicine

## 2019-10-19 NOTE — Telephone Encounter (Signed)
I left a message asking the patient to call and schedule Medicare AWV with Toni Amend [facility] Coach) on 10/25/2019 after seeing Dr. Mardelle Matte.  I'm waiting for a call back to either confirm or decline the appointment. If patient calls back, please update appointment notes.  VDM (Dee-Dee)

## 2019-10-25 ENCOUNTER — Encounter: Payer: Self-pay | Admitting: Family Medicine

## 2019-10-25 ENCOUNTER — Other Ambulatory Visit: Payer: Self-pay

## 2019-10-25 ENCOUNTER — Ambulatory Visit (INDEPENDENT_AMBULATORY_CARE_PROVIDER_SITE_OTHER): Payer: Medicare Other | Admitting: Family Medicine

## 2019-10-25 ENCOUNTER — Ambulatory Visit (INDEPENDENT_AMBULATORY_CARE_PROVIDER_SITE_OTHER): Payer: Medicare Other

## 2019-10-25 VITALS — BP 158/82 | Temp 97.7°F | Ht 66.0 in | Wt 179.5 lb

## 2019-10-25 VITALS — BP 130/72 | HR 78 | Temp 97.7°F | Ht 66.0 in | Wt 179.4 lb

## 2019-10-25 DIAGNOSIS — I4892 Unspecified atrial flutter: Secondary | ICD-10-CM

## 2019-10-25 DIAGNOSIS — Z0000 Encounter for general adult medical examination without abnormal findings: Secondary | ICD-10-CM

## 2019-10-25 DIAGNOSIS — E039 Hypothyroidism, unspecified: Secondary | ICD-10-CM | POA: Diagnosis not present

## 2019-10-25 DIAGNOSIS — Z79899 Other long term (current) drug therapy: Secondary | ICD-10-CM

## 2019-10-25 DIAGNOSIS — R030 Elevated blood-pressure reading, without diagnosis of hypertension: Secondary | ICD-10-CM

## 2019-10-25 DIAGNOSIS — F5101 Primary insomnia: Secondary | ICD-10-CM

## 2019-10-25 DIAGNOSIS — Z7901 Long term (current) use of anticoagulants: Secondary | ICD-10-CM

## 2019-10-25 DIAGNOSIS — I480 Paroxysmal atrial fibrillation: Secondary | ICD-10-CM

## 2019-10-25 DIAGNOSIS — M810 Age-related osteoporosis without current pathological fracture: Secondary | ICD-10-CM

## 2019-10-25 DIAGNOSIS — G609 Hereditary and idiopathic neuropathy, unspecified: Secondary | ICD-10-CM

## 2019-10-25 LAB — COMPREHENSIVE METABOLIC PANEL
ALT: 20 U/L (ref 0–35)
AST: 21 U/L (ref 0–37)
Albumin: 4.2 g/dL (ref 3.5–5.2)
Alkaline Phosphatase: 73 U/L (ref 39–117)
BUN: 20 mg/dL (ref 6–23)
CO2: 29 mEq/L (ref 19–32)
Calcium: 9.3 mg/dL (ref 8.4–10.5)
Chloride: 102 mEq/L (ref 96–112)
Creatinine, Ser: 1.1 mg/dL (ref 0.40–1.20)
GFR: 47.06 mL/min — ABNORMAL LOW (ref >60.00)
Glucose, Bld: 106 mg/dL — ABNORMAL HIGH (ref 70–99)
Potassium: 4.4 mEq/L (ref 3.5–5.1)
Sodium: 138 mEq/L (ref 135–145)
Total Bilirubin: 0.7 mg/dL (ref 0.2–1.2)
Total Protein: 7 g/dL (ref 6.0–8.3)

## 2019-10-25 LAB — CBC WITH DIFFERENTIAL/PLATELET
Basophils Absolute: 0.1 10*3/uL (ref 0.0–0.1)
Basophils Relative: 0.8 % (ref 0.0–3.0)
Eosinophils Absolute: 0.5 10*3/uL (ref 0.0–0.7)
Eosinophils Relative: 7.1 % — ABNORMAL HIGH (ref 0.0–5.0)
HCT: 45.3 % (ref 36.0–46.0)
Hemoglobin: 14.9 g/dL (ref 12.0–15.0)
Lymphocytes Relative: 34.2 % (ref 12.0–46.0)
Lymphs Abs: 2.5 10*3/uL (ref 0.7–4.0)
MCHC: 33 g/dL (ref 30.0–36.0)
MCV: 92.6 fl (ref 78.0–100.0)
Monocytes Absolute: 0.6 10*3/uL (ref 0.1–1.0)
Monocytes Relative: 8.8 % (ref 3.0–12.0)
Neutro Abs: 3.5 10*3/uL (ref 1.4–7.7)
Neutrophils Relative %: 49.1 % (ref 43.0–77.0)
Platelets: 265 10*3/uL (ref 150.0–400.0)
RBC: 4.89 Mil/uL (ref 3.87–5.11)
RDW: 14.7 % (ref 11.5–15.5)
WBC: 7.2 10*3/uL (ref 4.0–10.5)

## 2019-10-25 LAB — LIPID PANEL
Cholesterol: 199 mg/dL (ref 0–200)
HDL: 66.9 mg/dL (ref >39.00)
LDL Cholesterol: 116 mg/dL — ABNORMAL HIGH (ref 0–99)
NonHDL: 131.83
Total CHOL/HDL Ratio: 3
Triglycerides: 78 mg/dL (ref 0.0–149.0)
VLDL: 15.6 mg/dL (ref 0.0–40.0)

## 2019-10-25 LAB — TSH: TSH: 3.24 u[IU]/mL (ref 0.35–4.50)

## 2019-10-25 NOTE — Progress Notes (Signed)
Subjective:   Cathy Lambert is a 86 y.o. female who presents for Medicare Annual (Subsequent) preventive examination.  Review of Systems:   Cardiac Risk Factors include: advanced age (>85men, >55 women)   Objective:     Vitals: BP (!) 158/82   Temp 97.7 F (36.5 C) (Temporal)   Ht 5\' 6"  (1.676 m)   Wt 179 lb 7.3 oz (81.4 kg)   BMI 28.96 kg/m   Body mass index is 28.96 kg/m.  Advanced Directives 10/25/2019 03/04/2019 03/04/2019 04/01/2018  Does Patient Have a Medical Advance Directive? Yes Yes No Yes  Type of Advance Directive Living will;Healthcare Power of 04/03/2018 Power of Attorney - Healthcare Power of Attorney  Does patient want to make changes to medical advance directive? No - Patient declined No - Patient declined - Yes (MAU/Ambulatory/Procedural Areas - Information given)  Copy of Healthcare Power of Attorney in Chart? Yes - validated most recent copy scanned in chart (See row information) - - No - copy requested    Tobacco Social History   Tobacco Use  Smoking Status Never Smoker  Smokeless Tobacco Never Used     Counseling given: Not Answered   Clinical Intake:  Pre-visit preparation completed: Yes  Pain : No/denies pain  Diabetes: No  How often do you need to have someone help you when you read instructions, pamphlets, or other written materials from your doctor or pharmacy?: 1 - Never  Interpreter Needed?: No  Information entered by :: [email or web address] LPN  Past Medical History:  Diagnosis Date  . Atrial fibrillation (HCC)   . History of migraine - none since age 89 10/27/2017  . Osteoporosis 05/10/2019   dexa 04/2019, lowest T = -2.6 at hip. To discuss 10/20 and offer tx.  . Primary osteoarthritis of knee   . Thyroid disease    Past Surgical History:  Procedure Laterality Date  . BUNIONECTOMY  1991  . CARDIOVERSION N/A 03/08/2019   Procedure: CARDIOVERSION;  Surgeon: 03/10/2019 Elease Hashimoto, MD;  Location: [facility] ENDOSCOPY;  Service:  Cardiovascular;  Laterality: N/A;  . CATARACT EXTRACTION  2008-2009  . TEE WITHOUT CARDIOVERSION N/A 03/08/2019   Procedure: TRANSESOPHAGEAL ECHOCARDIOGRAM (TEE);  Surgeon: 03/10/2019 Elease Hashimoto, MD;  Location: [facility] ENDOSCOPY;  Service: Cardiovascular;  Laterality: N/A;  . TONSILLECTOMY AND ADENOIDECTOMY  1953   Family History  Problem Relation Age of Onset  . Arthritis Mother   . Cancer Mother   . Cancer Sister   . Diabetes Brother   . Cancer Maternal Grandmother   . Stroke Maternal Grandfather   . Cancer Paternal Grandfather   . Hearing loss Sister   . Cancer Son    Social History   Socioeconomic History  . Marital status: Divorced    Spouse name: Not on file  . Number of children: Not on file  . Years of education: Not on file  . Highest education level: Not on file  Occupational History  . Occupation: Retired     Comment: [employer]   Tobacco Use  . Smoking status: Never Smoker  . Smokeless tobacco: Never Used  Substance and Sexual Activity  . Alcohol use: No  . Drug use: No  . Sexual activity: Never    Birth control/protection: Post-menopausal  Other Topics Concern  . Not on file  Social History Narrative   Enjoys spending time with neighbors    Social Determinants of Health   Financial Resource Strain:   . Difficulty of Paying Living Expenses: Not on file  Food Insecurity:   . Worried About Charity fundraiser in the Last Year: Not on file  . Ran Out of Food in the Last Year: Not on file  Transportation Needs:   . Lack of Transportation (Medical): Not on file  . Lack of Transportation (Non-Medical): Not on file  Physical Activity:   . Days of Exercise per Week: Not on file  . Minutes of Exercise per Session: Not on file  Stress:   . Feeling of Stress : Not on file  Social Connections:   . Frequency of Communication with Friends and Family: Not on file  . Frequency of Social Gatherings with Friends and Family: Not on file  . Attends Religious Services: Not  on file  . Active Member of Clubs or Organizations: Not on file  . Attends Archivist Meetings: Not on file  . Marital Status: Not on file    Outpatient Encounter Medications as of 10/25/2019  Medication Sig  . B Complex Vitamins (VITAMIN B COMPLEX PO) Take 1 tablet by mouth daily with breakfast.   . Cholecalciferol (VITAMIN D-3 PO) Take 1 capsule by mouth daily with breakfast.  . ELIQUIS 5 MG TABS tablet TAKE 1 TABLET(5 MG) BY MOUTH TWICE DAILY  . levothyroxine (SYNTHROID) 50 MCG tablet TAKE 1 TABLET BY MOUTH IN THE MORNING BEFORE BREAKFAST ON SUNDAY/MONDAY/WEDNESDAY/FRIDAY/SATURDAY AND 2 TABLETS ON TUESDAYS AND THURSDAYS  . LORazepam (ATIVAN) 2 MG tablet Take 0.5 tablets (1 mg total) by mouth at bedtime as needed for sleep.  . Magnesium 500 MG TABS Take 500 mg by mouth at bedtime.  . metoprolol succinate (TOPROL XL) 25 MG 24 hr tablet Take 1 tablet (25 mg total) by mouth at bedtime.  . Probiotic Product (PROBIOTIC DAILY PO) Take 1 capsule by mouth daily.    No facility-administered encounter medications on file as of 10/25/2019.    Activities of Daily Living In your present state of health, do you have any difficulty performing the following activities: 10/25/2019 03/04/2019  Hearing? N N  Vision? N N  Difficulty concentrating or making decisions? N N  Walking or climbing stairs? N N  Dressing or bathing? N N  Doing errands, shopping? N N  Preparing Food and eating ? N -  Using the Toilet? N -  In the past six months, have you accidently leaked urine? N -  Do you have problems with loss of bowel control? N -  Managing your Medications? N -  Managing your Finances? N -  Housekeeping or managing your Housekeeping? N -  Some recent data might be hidden    Patient Care Team: Leamon Arnt, MD as PCP - General (Family Medicine) Elouise Munroe, MD as PCP - Cardiology (Cardiology) Monna Fam, MD as Consulting Physician (Ophthalmology)    Assessment:   This is a  routine wellness examination for Nature.  Exercise Activities and Dietary recommendations Current Exercise Habits: The patient does not participate in regular exercise at present  Goals    . Exercise 150 min/wk Moderate Activity     Continue being active at pool and [facility]. Lose about 7 pounds.        Fall Risk Fall Risk  10/25/2019 10/25/2019 10/21/2018 04/01/2018 10/27/2017  Falls in the past year? 0 0 0 No No  Number falls in past yr: 0 - 0 - -  Injury with Fall? 0 - 0 - -  Follow up Falls evaluation completed;Education provided;Falls prevention discussed Falls evaluation completed Falls evaluation completed - -  Is the patient's home free of loose throw rugs in walkways, pet beds, electrical cords, etc?   yes      Grab bars in the bathroom? yes      Handrails on the stairs?   yes      Adequate lighting?   yes  Timed Get Up and Go performed: completed and within normal timeframe; no gait abnormalities noted   Depression Screen PHQ 2/9 Scores 10/25/2019 10/25/2019 10/21/2018 04/01/2018  PHQ - 2 Score 0 0 0 0  PHQ- 9 Score - - - -     Cognitive Function MMSE - Mini Mental State Exam 04/01/2018  Orientation to time 5  Orientation to Place 5  Registration 3  Attention/ Calculation 5  Recall 3  Language- name 2 objects 2  Language- repeat 1  Language- follow 3 step command 3  Language- read & follow direction 1  Write a sentence 1  Copy design 1  Total score 30     6CIT Screen 10/25/2019  What Year? 0 points  What month? 0 points  What time? 0 points  Count back from 20 0 points  Months in reverse 0 points  Repeat phrase 0 points  Total Score 0    Immunization History  Administered Date(s) Administered  . Influenza, High Dose Seasonal PF 06/07/2017  . Influenza,inj,Quad PF,6+ Mos 06/24/2018  . PFIZER SARS-COV-2 Vaccination 09/08/2019, 09/27/2019  . Zoster 11/06/2007    Qualifies for Shingles Vaccine?Discussed and patient will check with pharmacy for coverage.  Patient  education handout provided   Screening Tests Health Maintenance  Topic Date Due  . MAMMOGRAM  05/09/2020  . DEXA SCAN  05/09/2021  . TETANUS/TDAP  06/16/2022  . INFLUENZA VACCINE  Completed  . PNA vac Low Risk Adult  Completed    Cancer Screenings: Lung: Low Dose CT Chest recommended if Age 23-80 years, 30 pack-year currently smoking OR have quit w/in 15years. Patient does not qualify. Breast:  Up to date on Mammogram? Yes   Up to date of Bone Density/Dexa? Yes Colorectal: No longer indicated      Plan:  I have personally reviewed and addressed the Medicare Annual Wellness questionnaire and have noted the following in the patient's chart:  A. Medical and social history B. Use of alcohol, tobacco or illicit drugs  C. Current medications and supplements D. Functional ability and status E.  Nutritional status F.  Physical activity G. Advance directives H. List of other physicians I.  Hospitalizations, surgeries, and ER visits in previous 12 months J.  Vitals K. Screenings such as hearing and vision if needed, cognitive and depression L. Referrals, records requested, and appointments- none   In addition, I have reviewed and discussed with patient certain preventive protocols, quality metrics, and best practice recommendations. A written personalized care plan for preventive services as well as general preventive health recommendations were provided to patient.   Signed,  Kandis Fantasia, LPN  Nurse Health Advisor   Nurse Notes: Patient has completed Covid vaccine

## 2019-10-25 NOTE — Patient Instructions (Signed)
Please return in 6 months for recheck.   I will release your lab results to you on your MyChart account with further instructions. Please reply with any questions.    If you have any questions or concerns, please don't hesitate to send me a message via MyChart or call the office at [phone]. Thank you for visiting with [place] today! It's our pleasure caring for you.   Calcium Intake Recommendations You can take Caltrate Plus twice a day or get it through your diet or other OTC supplements (Viactiv, OsCal etc)  Calcium is a mineral that affects many functions in the body, including:  Blood clotting.  Blood vessel function.  Nerve impulse conduction.  Hormone secretion.  Muscle contraction.  Bone and teeth functions.  Most of your body's calcium supply is stored in your bones and teeth. When your calcium stores are low, you may be at risk for low bone mass, bone loss, and bone fractures. Consuming enough calcium helps to grow healthy bones and teeth and to prevent breakdown over time. It is very important that you get enough calcium if you are:  A child undergoing rapid growth.  An adolescent girl.  A pre- or post-menopausal woman.  A woman whose menstrual cycle has stopped due to anorexia nervosa or regular intense exercise.  An individual with lactose intolerance or a milk allergy.  A vegetarian.  What is my plan? Try to consume the recommended amount of calcium daily based on your age. Depending on your overall health, your health care provider may recommend increased calcium intake.General daily calcium intake recommendations by age are:  Birth to 6 months: 200 mg.  Infants 7 to 12 months: 260 mg.  Children 1 to 3 years: 700 mg.  Children 4 to 8 years: 1,000 mg.  Children 9 to 13 years: 1,300 mg.  Teens 14 to 18 years: 1,300 mg.  Adults 19 to 50 years: 1,000 mg.  Adult women 51 to 70 years: 1,200 mg.  Adult men 51 to 70 years: 1,000 mg.  Adults 71 years  and older: 1,200 mg.  Pregnant and breastfeeding teens: 1,300 mg.  Pregnant and breastfeeding adults: 1,000 mg.  What do I need to know about calcium intake?  In order for the body to absorb calcium, it needs vitamin D. You can get vitamin D through (we recommend getting [phone] units of Vitamin D daily) ? Direct exposure of the skin to sunlight. ? Foods, such as egg yolks, liver, saltwater fish, and fortified milk. ? Supplements.  Consuming too much calcium may cause: ? Constipation. ? Decreased absorption of iron and zinc. ? Kidney stones.  Calcium supplements may interact with certain medicines. Check with your health care provider before starting any calcium supplements.  Try to get most of your calcium from food. What foods can I eat? Grains  Fortified oatmeal. Fortified ready-to-eat cereals. Fortified frozen waffles. Vegetables Turnip greens. Broccoli. Fruits Fortified orange juice. Meats and Other Protein Sources Canned sardines with bones. Canned salmon with bones. Soy beans. Tofu. Baked beans. Almonds. [place] nuts. Sunflower seeds. Dairy Milk. Yogurt. Cheese. Cottage cheese. Beverages Fortified soy milk. Fortified rice milk. Sweets/Desserts Pudding. Ice Cream. Milkshakes. Blackstrap molasses. The items listed above may not be a complete list of recommended foods or beverages. Contact your dietitian for more options. What foods can affect my calcium intake? It may be more difficult for your body to use calcium or calcium may leave your body more quickly if you consume large amounts of:  Sodium.  Protein.  Caffeine.  Alcohol.  This information is not intended to replace advice given to you by your health care provider. Make sure you discuss any questions you have with your health care provider. Document Released: 03/19/2004 Document Revised: 02/23/2016 Document Reviewed: 01/11/2014 Elsevier Interactive Patient Education  2018 [employer].

## 2019-10-25 NOTE — Progress Notes (Signed)
Subjective  Chief Complaint  Patient presents with  . Annual Exam    fasting. fatigu, thinks it's Metoprolol that is causing this  . Hypothyroidism    takes levothyroxine daily    HPI: Cathy Lambert is a 86 y.o. female who presents to [facility] [place] today for a Female Wellness Visit. She also has the concerns and/or needs as listed above in the chief complaint. These will be addressed in addition to the Health Maintenance Visit.   Wellness Visit: annual visit with health maintenance review and exam without Pap   HM: 86 yo active independent female; doing well. Less exercise due to covid but looking forward to doing more now that weather is improving. imms up to date including covid vaccinations. Feels tired Chronic disease f/u and/or acute problem visit: (deemed necessary to be done in addition to the wellness visit):  Aflutter: reviewed cards notes. Last seen last month. On eloquis and in sinus rhythm. Feels like BB may be making her tired but cards changed to toprol xl to see if she tolerates that better. No bleeding.  Low thyroid. Due for recheck on supplements.   Check bp at home and always NORMAL. Has white coat syndrome  Reviewed osteoporosis from dexa 05/2019. Lowest T =-2.6 no h/o fractures. Takes Vit D but not calcium.   Insomnia on chronic benzo's. Unchanged and controlled.   Has AWV today.  Assessment  1. Annual physical exam   2. Acquired hypothyroidism   3. Paroxysmal atrial fibrillation (HCC)   4. Primary insomnia   5. Atrial flutter, unspecified type (HCC)   6. Current use of long term anticoagulation   7. Chronic prescription benzodiazepine use   8. Peripheral neuropathy, idiopathic   9. Age-related osteoporosis without current pathological fracture   10. White coat syndrome without diagnosis of hypertension      Plan  Female Wellness Visit:  Age appropriate Health Maintenance and Prevention measures were discussed with  patient. Included topics are cancer screening recommendations, ways to keep healthy (see AVS) including dietary and exercise recommendations, regular eye and dental care, use of seat belts, and avoidance of moderate alcohol use and tobacco use. Screens are up to date  BMI: discussed patient's BMI and encouraged positive lifestyle modifications to help get to or maintain a target BMI.  HM needs and immunizations were addressed and ordered. See below for orders. See HM and immunization section for updates.  Routine labs and screening tests ordered including cmp, cbc and lipids where appropriate.  Discussed recommendations regarding Vit D and calcium supplementation (see AVS)  Chronic disease management visit and/or acute problem visit:  Osteoporosis: pt declines meds at this time. This is reasonable. Add calcium and weight bearing exercise and recheck 04/2021. To start meds at that time if worsening  Aflutter now in sinus and tolerating eloquis with mild fatigue on BB. Reassured. Check lipid panel and may warrant statin tx. This was discussed.  Insomnia on benzo's.   Monitor home bps.   Check thyroid levels.  Follow up: Return in about 6 months (around 04/26/2020) for recheck.  Orders Placed This Encounter  Procedures  . CBC with Differential/Platelet  . Comprehensive metabolic panel  . Lipid panel  . TSH   No orders of the defined types were placed in this encounter.     Lifestyle: Body mass index is 28.96 kg/m. Wt Readings from Last 3 Encounters:  10/25/19 179 lb 6.4 oz (81.4 kg)  09/13/19 178 lb 9.6  oz (81 kg)  06/15/19 175 lb 6.4 oz (79.6 kg)    Patient Active Problem List   Diagnosis Date Noted  . Osteoporosis 05/10/2019    Priority: High    dexa 04/2019, lowest T = -2.6 at hip. To discuss 10/20 and offer tx.   . Atrial fibrillation (HCC)     Priority: High  . Wide-complex tachycardia (HCC) 03/04/2019    Priority: High  . Acquired hypothyroidism 10/27/2017     Priority: High  . Peripheral neuropathy, idiopathic 06/24/2018    Priority: Medium  . Primary osteoarthritis of knee     Priority: Medium  . Patellofemoral pain syndrome of right knee 06/24/2018    Priority: Low  . History of migraine - none since age 55 10/27/2017    Priority: Low  . White coat syndrome without diagnosis of hypertension 10/25/2019  . Atrial flutter (HCC) 09/26/2019  . Primary insomnia 05/25/2019  . Chronic prescription benzodiazepine use 05/25/2019  . Current use of long term anticoagulation 05/25/2019   Health Maintenance  Topic Date Due  . MAMMOGRAM  05/09/2020  . DEXA SCAN  05/09/2021  . TETANUS/TDAP  06/16/2022  . INFLUENZA VACCINE  Completed  . PNA vac Low Risk Adult  Completed   Immunization History  Administered Date(s) Administered  . Influenza, High Dose Seasonal PF 06/07/2017  . Influenza,inj,Quad PF,6+ Mos 06/24/2018  . PFIZER SARS-COV-2 Vaccination 09/08/2019, 09/27/2019  . Zoster 11/06/2007   We updated and reviewed the patient's past history in detail and it is documented below. Allergies: Patient has No Known Allergies. Past Medical History Patient  has a past medical history of Atrial fibrillation (HCC), History of migraine - none since age 39 (10/27/2017), Osteoporosis (05/10/2019), Primary osteoarthritis of knee, and Thyroid disease. Past Surgical History Patient  has a past surgical history that includes Bunionectomy (1991); Cataract extraction (2008-2009); Tonsillectomy and adenoidectomy (1953); TEE without cardioversion (N/A, 03/08/2019); and Cardioversion (N/A, 03/08/2019). Family History: Patient family history includes Arthritis in her mother; Cancer in her maternal grandmother, mother, paternal grandfather, sister, and son; Diabetes in her brother; Hearing loss in her sister; Stroke in her maternal grandfather. Social History:  Patient  reports that she has never smoked. She has never used smokeless tobacco. She reports that she does not  drink alcohol or use drugs.  Review of Systems: Constitutional: negative for fever or malaise Ophthalmic: negative for photophobia, double vision or loss of vision Cardiovascular: negative for chest pain, dyspnea on exertion, or new LE swelling Respiratory: negative for SOB or persistent cough Gastrointestinal: negative for abdominal pain, change in bowel habits or melena Genitourinary: negative for dysuria or gross hematuria, no abnormal uterine bleeding or disharge Musculoskeletal: negative for new gait disturbance or muscular weakness Integumentary: negative for new or persistent rashes, no breast lumps Neurological: negative for TIA or stroke symptoms Psychiatric: negative for SI or delusions Allergic/Immunologic: negative for hives  Patient Care Team    Relationship Specialty Notifications Start End  Willow Ora, MD PCP - General Family Medicine  10/27/17   Parke Poisson, MD PCP - Cardiology Cardiology Admissions 03/08/19    BP Readings from Last 3 Encounters:  10/25/19 130/72  09/13/19 (!) 146/90  06/15/19 (!) 180/100    Objective  Vitals: BP 130/72 Comment: by consistent home readings  Pulse 78   Temp 97.7 F (36.5 C) (Temporal)   Ht 5\' 6"  (1.676 m)   Wt 179 lb 6.4 oz (81.4 kg)   SpO2 96%   BMI 28.96 kg/m  General:  Well developed, well nourished, no acute distress  Psych:  Alert and orientedx3,normal mood and affect HEENT:  Normocephalic, atraumatic, non-icteric sclera, PERRL, pple neck without adenopathy, mass or thyromegaly Cardiovascular:  Normal S1, S2, RRR without gallop, rub or murmur, nondisplaced PMI Respiratory:  Good breath sounds bilaterally, CTAB with normal respiratory effort Gastrointestinal: normal bowel sounds, soft, non-tender, no noted masses. No HSM MSK: no deformities, contusions. Joints are without erythema or swelling. Spine and CVA region are nontender Skin:  Warm, no rashes or suspicious lesions noted Neurologic:    Mental status is  normal. C Gross motor and sensory exams are normal. Normal gait. No tremor    Commons side effects, risks, benefits, and alternatives for medications and treatment plan prescribed today were discussed, and the patient expressed understanding of the given instructions. Patient is instructed to call or message via MyChart if he/she has any questions or concerns regarding our treatment plan. No barriers to understanding were identified. We discussed Red Flag symptoms and signs in detail. Patient expressed understanding regarding what to do in case of urgent or emergency type symptoms.   Medication list was reconciled, printed and provided to the patient in AVS. Patient instructions and summary information was reviewed with the patient as documented in the AVS. This note was prepared with assistance of Dragon voice recognition software. Occasional wrong-word or sound-a-like substitutions may have occurred due to the inherent limitations of voice recognition software  This visit occurred during the SARS-CoV-2 public health emergency.  Safety protocols were in place, including screening questions prior to the visit, additional usage of staff PPE, and extensive cleaning of exam room while observing appropriate contact time as indicated for disinfecting solutions.

## 2019-10-25 NOTE — Patient Instructions (Signed)
Cathy Lambert , Thank you for taking time to come for your Medicare Wellness Visit. I appreciate your ongoing commitment to your health goals. Please review the following plan we discussed and let me know if I can assist you in the future.   Screening recommendations/referrals: Colorectal Screening: No longer indicated  Mammogram: up to date; last 05/10/19 Bone Density: up to date; last 05/10/19  Vision and Dental Exams: Recommended annual ophthalmology exams for early detection of glaucoma and other disorders of the eye Recommended annual dental exams for proper oral hygiene  Vaccinations: Influenza vaccine: completed 05/18/19 Pneumococcal vaccine: up to date; last 08/15/14 Tdap vaccine: up to date; last 06/16/12 Shingles vaccine: You may receive this vaccine at your local pharmacy. (see handout)   Advanced directives: We have received a copy of your POA (Power of [place]) and/or Living Will. These documents can be located in your chart.  Goals: Recommend to drink at least 6-8 8oz glasses of water per day and consume a balanced diet rich in fresh fruits and vegetables.   Next appointment: Please schedule your Annual Wellness Visit with your Nurse Health Advisor in one year.  Preventive Care 84 Years and Older, Female Preventive care refers to lifestyle choices and visits with your health care provider that can promote health and wellness. What does preventive care include?  A yearly physical exam. This is also called an annual well check.  Dental exams once or twice a year.  Routine eye exams. Ask your health care provider how often you should have your eyes checked.  Personal lifestyle choices, including:  Daily care of your teeth and gums.  Regular physical activity.  Eating a healthy diet.  Avoiding tobacco and drug use.  Limiting alcohol use.  Practicing safe sex.  Taking low-dose aspirin every day if recommended by your health care provider.  Taking vitamin and  mineral supplements as recommended by your health care provider. What happens during an annual well check? The services and screenings done by your health care provider during your annual well check will depend on your age, overall health, lifestyle risk factors, and family history of disease. Counseling  Your health care provider may ask you questions about your:  Alcohol use.  Tobacco use.  Drug use.  Emotional well-being.  Home and relationship well-being.  Sexual activity.  Eating habits.  History of falls.  Memory and ability to understand (cognition).  Work and work Astronomer.  Reproductive health. Screening  You may have the following tests or measurements:  Height, weight, and BMI.  Blood pressure.  Lipid and cholesterol levels. These may be checked every 5 years, or more frequently if you are over 84 years old.  Skin check.  Lung cancer screening. You may have this screening every year starting at age 84 if you have a 30-pack-year history of smoking and currently smoke or have quit within the past 15 years.  Fecal occult blood test (FOBT) of the stool. You may have this test every year starting at age 84.  Flexible sigmoidoscopy or colonoscopy. You may have a sigmoidoscopy every 5 years or a colonoscopy every 10 years starting at age 84.  Hepatitis C blood test.  Hepatitis B blood test.  Sexually transmitted disease (STD) testing.  Diabetes screening. This is done by checking your blood sugar (glucose) after you have not eaten for a while (fasting). You may have this done every 1-3 years.  Bone density scan. This is done to screen for osteoporosis. You may have this  done starting at age 69.  Mammogram. This may be done every 1-2 years. Talk to your health care provider about how often you should have regular mammograms. Talk with your health care provider about your test results, treatment options, and if necessary, the need for more tests. Vaccines   Your health care provider may recommend certain vaccines, such as:  Influenza vaccine. This is recommended every year.  Tetanus, diphtheria, and acellular pertussis (Tdap, Td) vaccine. You may need a Td booster every 10 years.  Zoster vaccine. You may need this after age 84.  Pneumococcal 13-valent conjugate (PCV13) vaccine. One dose is recommended after age 84.  Pneumococcal polysaccharide (PPSV23) vaccine. One dose is recommended after age 84. Talk to your health care provider about which screenings and vaccines you need and how often you need them. This information is not intended to replace advice given to you by your health care provider. Make sure you discuss any questions you have with your health care provider. Document Released: 09/01/2015 Document Revised: 04/24/2016 Document Reviewed: 06/06/2015 Elsevier Interactive Patient Education  2017 [employer] Prevention in the Home Falls can cause injuries. They can happen to people of all ages. There are many things you can do to make your home safe and to help prevent falls. What can I do on the outside of my home?  Regularly fix the edges of walkways and driveways and fix any cracks.  Remove anything that might make you trip as you walk through a door, such as a raised step or threshold.  Trim any bushes or trees on the path to your home.  Use bright outdoor lighting.  Clear any walking paths of anything that might make someone trip, such as rocks or tools.  Regularly check to see if handrails are loose or broken. Make sure that both sides of any steps have handrails.  Any raised decks and porches should have guardrails on the edges.  Have any leaves, snow, or ice cleared regularly.  Use sand or salt on walking paths during winter.  Clean up any spills in your garage right away. This includes oil or grease spills. What can I do in the bathroom?  Use night lights.  Install grab bars by the toilet and in the  tub and shower. Do not use towel bars as grab bars.  Use non-skid mats or decals in the tub or shower.  If you need to sit down in the shower, use a plastic, non-slip stool.  Keep the floor dry. Clean up any water that spills on the floor as soon as it happens.  Remove soap buildup in the tub or shower regularly.  Attach bath mats securely with double-sided non-slip rug tape.  Do not have throw rugs and other things on the floor that can make you trip. What can I do in the bedroom?  Use night lights.  Make sure that you have a light by your bed that is easy to reach.  Do not use any sheets or blankets that are too big for your bed. They should not hang down onto the floor.  Have a firm chair that has side arms. You can use this for support while you get dressed.  Do not have throw rugs and other things on the floor that can make you trip. What can I do in the kitchen?  Clean up any spills right away.  Avoid walking on wet floors.  Keep items that you use a lot in easy-to-reach places.  If you need to reach something above you, use a strong step stool that has a grab bar.  Keep electrical cords out of the way.  Do not use floor polish or wax that makes floors slippery. If you must use wax, use non-skid floor wax.  Do not have throw rugs and other things on the floor that can make you trip. What can I do with my stairs?  Do not leave any items on the stairs.  Make sure that there are handrails on both sides of the stairs and use them. Fix handrails that are broken or loose. Make sure that handrails are as long as the stairways.  Check any carpeting to make sure that it is firmly attached to the stairs. Fix any carpet that is loose or worn.  Avoid having throw rugs at the top or bottom of the stairs. If you do have throw rugs, attach them to the floor with carpet tape.  Make sure that you have a light switch at the top of the stairs and the bottom of the stairs. If you  do not have them, ask someone to add them for you. What else can I do to help prevent falls?  Wear shoes that:  Do not have high heels.  Have rubber bottoms.  Are comfortable and fit you well.  Are closed at the toe. Do not wear sandals.  If you use a stepladder:  Make sure that it is fully opened. Do not climb a closed stepladder.  Make sure that both sides of the stepladder are locked into place.  Ask someone to hold it for you, if possible.  Clearly mark and make sure that you can see:  Any grab bars or handrails.  First and last steps.  Where the edge of each step is.  Use tools that help you move around (mobility aids) if they are needed. These include:  Canes.  Walkers.  Scooters.  Crutches.  Turn on the lights when you go into a dark area. Replace any light bulbs as soon as they burn out.  Set up your furniture so you have a clear path. Avoid moving your furniture around.  If any of your floors are uneven, fix them.  If there are any pets around you, be aware of where they are.  Review your medicines with your doctor. Some medicines can make you feel dizzy. This can increase your chance of falling. Ask your doctor what other things that you can do to help prevent falls. This information is not intended to replace advice given to you by your health care provider. Make sure you discuss any questions you have with your health care provider. Document Released: 06/01/2009 Document Revised: 01/11/2016 Document Reviewed: 09/09/2014 Elsevier Interactive Patient Education  2017 [employer].

## 2019-11-22 ENCOUNTER — Telehealth: Payer: Self-pay | Admitting: Internal Medicine

## 2019-11-22 NOTE — Telephone Encounter (Signed)
Contacted patient- she states that she feels fine today, but has had some issues with going in and out of AFIB recently.  She states she will have issues, use the ice pack, and then it goes away, her HR jumps around but it stays low 70's, and 80's but it goes all over the place quickly. I did advised her that she should be seen by AFIB clinic- since she just seen them in October and this is new for her, they can direct her or have her be seen since this is happening even more.   Offered to call them, but patient states she will call them since her phone is acting up.  Advised she would call them now to get an appointment. Patient thankful for call.

## 2019-11-22 NOTE — Telephone Encounter (Signed)
I placed a call to patient and left message to call office.

## 2019-11-22 NOTE — Telephone Encounter (Signed)
Talked with patient - reassured she treated her breakthrough afib appropriately.  Pt will continue to monitor her AF burden if it increases she will call me back to move up her appointment.  Pt in agreement.

## 2019-11-22 NOTE — Telephone Encounter (Signed)
Patient c/o Palpitations:  High priority if patient c/o lightheadedness, shortness of breath, or chest pain  1) How long have you had palpitations/irregular HR/ Afib? Are you having the symptoms now? No   2) Are you currently experiencing lightheadedness, SOB or CP? NO  3) Do you have a history of afib (atrial fibrillation) or irregular heart rhythm? Does have history of A-Fib.   4) Have you checked your BP or HR? (document readings if available): no   5) Are you experiencing any other symptoms?   She had irregular HR on Friday she did take her medication and placed ice pack on her chest for 30 mins.  She states that by Saturday morning she was feeling better.  She states that every 4th beat had an extra beat in  it.

## 2019-11-22 NOTE — Telephone Encounter (Signed)
Called patient. No answer. Left message to call back.  

## 2019-12-14 ENCOUNTER — Ambulatory Visit (HOSPITAL_COMMUNITY)
Admission: RE | Admit: 2019-12-14 | Discharge: 2019-12-14 | Disposition: A | Discharge status: Home / Self Care | Payer: Medicare Other | Source: Ambulatory Visit | Attending: Physician Assistant | Admitting: Physician Assistant

## 2019-12-14 ENCOUNTER — Encounter (HOSPITAL_COMMUNITY): Payer: Self-pay | Admitting: Physician Assistant

## 2019-12-14 ENCOUNTER — Other Ambulatory Visit: Payer: Self-pay

## 2019-12-14 VITALS — BP 138/76 | HR 75 | Ht 66.0 in | Wt 182.0 lb

## 2019-12-14 DIAGNOSIS — I4510 Unspecified right bundle-branch block: Secondary | ICD-10-CM | POA: Insufficient documentation

## 2019-12-14 DIAGNOSIS — E039 Hypothyroidism, unspecified: Secondary | ICD-10-CM | POA: Insufficient documentation

## 2019-12-14 DIAGNOSIS — I429 Cardiomyopathy, unspecified: Secondary | ICD-10-CM | POA: Diagnosis not present

## 2019-12-14 DIAGNOSIS — Z823 Family history of stroke: Secondary | ICD-10-CM | POA: Insufficient documentation

## 2019-12-14 DIAGNOSIS — Z79899 Other long term (current) drug therapy: Secondary | ICD-10-CM | POA: Insufficient documentation

## 2019-12-14 DIAGNOSIS — Z7901 Long term (current) use of anticoagulants: Secondary | ICD-10-CM | POA: Diagnosis not present

## 2019-12-14 DIAGNOSIS — I4892 Unspecified atrial flutter: Secondary | ICD-10-CM

## 2019-12-14 DIAGNOSIS — I4891 Unspecified atrial fibrillation: Secondary | ICD-10-CM | POA: Diagnosis not present

## 2019-12-14 DIAGNOSIS — D6869 Other thrombophilia: Secondary | ICD-10-CM | POA: Insufficient documentation

## 2019-12-14 DIAGNOSIS — Z79890 Hormone replacement therapy (postmenopausal): Secondary | ICD-10-CM | POA: Diagnosis not present

## 2019-12-14 MED ORDER — METOPROLOL SUCCINATE ER 25 MG PO TB24: 12.5000 mg | ORAL_TABLET | Freq: Every day | ORAL | 6 refills | Status: DC

## 2019-12-14 NOTE — Progress Notes (Signed)
Primary Care Physician: Cathy Ora, MD Primary Cardiologist: Dr Cathy Lambert Primary Electrophysiologist: none Referring Physician: Dr Cathy Lambert is a 86 y.o. female with a history of hypothyroidism, migraines and atrial flutter who presents for follow up in the [facility].  The patient was initially diagnosed with atrial flutter after presenting to the ER on 03/04/19 with symptoms of heart racing in the 140s Bpm. No other associated symptoms, but in hindsight she does note that she was more fatigued. There were no triggers that the patient could identify. She underwent TEE/DCCV after being loaded on IV amiodarone. Patient's amiodarone was stopped 2/2 abnormal thyroid panel. On follow up 05/2019 she was maintaining SR off AAD. Her EF normalized on echo 06/22/19.  On follow up today, patient reports doing well since her last visit. She did have two episodes of irregular pulse which lasted < 30 minutes. She denies any other associated symptoms. She does still report fatigue since starting BB. She does admit she has not been as active recently.   Today, she denies symptoms of chest pain, shortness of breath, orthopnea, PND, lower extremity edema, dizziness, presyncope, syncope, snoring, daytime somnolence, bleeding, or neurologic sequela. The patient is tolerating medications without difficulties and is otherwise without complaint today.    Atrial Fibrillation Risk Factors:  she does not have symptoms or diagnosis of sleep apnea. she does not have a history of rheumatic fever. she does not have a history of alcohol use. The patient does not have a history of early familial atrial fibrillation or other arrhythmias.  she has a BMI of Body mass index is 29.38 kg/m.[name] Kitchen Filed Weights   12/14/19 1031  Weight: 82.6 kg    Family History  Problem Relation Age of Onset  . Arthritis Mother   . Cancer Mother   . Cancer Sister   . Diabetes Brother   .  Cancer Maternal Grandmother   . Stroke Maternal Grandfather   . Cancer Paternal Grandfather   . Hearing loss Sister   . Cancer Son      Atrial Fibrillation Management history:  Previous antiarrhythmic drugs: amiodarone  Previous cardioversions: 03/08/19 Previous ablations: none CHADS2VASC score: 3 Anticoagulation history: Eliquis   Past Medical History:  Diagnosis Date  . Atrial fibrillation (HCC)   . History of migraine - none since age 44 10/27/2017  . Osteoporosis 05/10/2019   dexa 04/2019, lowest T = -2.6 at hip. To discuss 10/20 and offer tx.  . Primary osteoarthritis of knee   . Thyroid disease    Past Surgical History:  Procedure Laterality Date  . BUNIONECTOMY  1991  . CARDIOVERSION N/A 03/08/2019   Procedure: CARDIOVERSION;  Surgeon: Elease Hashimoto Deloris Ping, MD;  Location: [facility] ENDOSCOPY;  Service: Cardiovascular;  Laterality: N/A;  . CATARACT EXTRACTION  2008-2009  . TEE WITHOUT CARDIOVERSION N/A 03/08/2019   Procedure: TRANSESOPHAGEAL ECHOCARDIOGRAM (TEE);  Surgeon: Elease Hashimoto Deloris Ping, MD;  Location: [facility] ENDOSCOPY;  Service: Cardiovascular;  Laterality: N/A;  . TONSILLECTOMY AND ADENOIDECTOMY  1953    Current Outpatient Medications  Medication Sig Dispense Refill  . B Complex Vitamins (VITAMIN B COMPLEX PO) Take 1 tablet by mouth daily with breakfast.     . Cholecalciferol (VITAMIN D-3 PO) Take 1 capsule by mouth daily with breakfast.    . ELIQUIS 5 MG TABS tablet TAKE 1 TABLET(5 MG) BY MOUTH TWICE DAILY 180 tablet 1  . levothyroxine (SYNTHROID) 50 MCG tablet TAKE 1 TABLET BY MOUTH IN THE  MORNING BEFORE BREAKFAST ON SUNDAY/MONDAY/WEDNESDAY/FRIDAY/SATURDAY AND 2 TABLETS ON TUESDAYS AND THURSDAYS 450 tablet 2  . LORazepam (ATIVAN) 2 MG tablet Take 0.5 tablets (1 mg total) by mouth at bedtime as needed for sleep. 30 tablet 3  . Magnesium 500 MG TABS Take 500 mg by mouth at bedtime.    . metoprolol succinate (TOPROL XL) 25 MG 24 hr tablet Take 0.5 tablets (12.5 mg total) by mouth at  bedtime. 30 tablet 6  . Probiotic Product (PROBIOTIC DAILY PO) Take 1 capsule by mouth daily.      No current facility-administered medications for this encounter.    No Known Allergies  Social History   Socioeconomic History  . Marital status: Divorced    Spouse name: Not on file  . Number of children: Not on file  . Years of education: Not on file  . Highest education level: Not on file  Occupational History  . Occupation: Retired     Comment: Engineer, water   Tobacco Use  . Smoking status: Never Smoker  . Smokeless tobacco: Never Used  Substance and Sexual Activity  . Alcohol use: Yes    Alcohol/week: 1.0 standard drinks    Types: 1 Cans of beer per week    Comment: once a month  . Drug use: No  . Sexual activity: Never    Birth control/protection: Post-menopausal  Other Topics Concern  . Not on file  Social History Narrative   Enjoys spending time with neighbors    Social Determinants of Health   Financial Resource Strain:   . Difficulty of Paying Living Expenses:   Food Insecurity:   . Worried About Charity fundraiser in the Last Year:   . Arboriculturist in the Last Year:   Transportation Needs:   . Film/video editor (Medical):   [name] Kitchen Lack of Transportation (Non-Medical):   Physical Activity:   . Days of Exercise per Week:   . Minutes of Exercise per Session:   Stress:   . Feeling of Stress :   Social Connections:   . Frequency of Communication with Friends and Family:   . Frequency of Social Gatherings with Friends and Family:   . Attends Religious Services:   . Active Member of Clubs or Organizations:   . Attends Archivist Meetings:   [name] Kitchen Marital Status:   Intimate Partner Violence:   . Fear of Current or Ex-Partner:   . Emotionally Abused:   [name] Kitchen Physically Abused:   . Sexually Abused:      ROS- All systems are reviewed and negative except as per the HPI above.  Physical Exam: Vitals:   12/14/19 1031  BP: 138/76  Pulse: 75   Weight: 82.6 kg  Height: 5\' 6"  (1.676 m)    GEN- The patient is well appearing elderly female, alert and oriented x 3 today.   HEENT-head normocephalic, atraumatic, sclera clear, conjunctiva pink, hearing intact, trachea midline. Lungs- Clear to ausculation bilaterally, normal work of breathing Heart- Regular rate and rhythm, no murmurs, rubs or gallops  GI- soft, NT, ND, + BS Extremities- no clubbing, cyanosis, or edema MS- no significant deformity or atrophy Skin- no rash or lesion Psych- euthymic mood, full affect Neuro- strength and sensation are intact   Wt Readings from Last 3 Encounters:  12/14/19 82.6 kg  10/25/19 81.4 kg  10/25/19 81.4 kg    EKG today demonstrates SR HR 75, inc RBBB, PR 184, QRS 86, QTc 410  TEE 03/08/19 1. No evidence  of a thrombus present in the left atrial appendage.  2. The aortic valve is grossly normal Aortic valve regurgitation is mild by color flow Doppler.  3. The tricuspid valve was grossly normal.  4. The aortic root is normal in size and structure.  5. There is evidence of mild plaque in the descending aorta.  6. The left ventricle has normal systolic function, with an ejection fraction of 55-60%.  FINDINGS  Left Ventricle: The left ventricle has normal systolic function, with an ejection fraction of 55-60%.  TEE documentation note mentioned EF 35-40%   Echo 06/22/19 1. Left ventricular ejection fraction, by visual estimation, is 60 to  65%. The left ventricle has normal function. There is no left ventricular  hypertrophy.  2. Left ventricular diastolic parameters are indeterminate.  3. Global right ventricle has normal systolic function.The right  ventricular size is normal. No increase in right ventricular wall  thickness.  4. Left atrial size was normal.  5. Right atrial size was normal.  6. The mitral valve is normal in structure. No evidence of mitral valve  regurgitation. No evidence of mitral stenosis.  7. The  tricuspid valve is normal in structure. Tricuspid valve  regurgitation is trivial.  8. The aortic valve was not assessed. Aortic valve regurgitation is mild.  No evidence of aortic valve sclerosis or stenosis.  9. The pulmonic valve was normal in structure. Pulmonic valve  regurgitation is trivial.  10. Normal pulmonary artery systolic pressure.  11. The atrial septum is grossly normal.    Epic records are reviewed at length today   Assessment and Plan:  1. Atrial flutter S/p TEE/DCCV 03/08/19. Amiodarone d/c'd due to abnormal thyroid panel. Patient appears to be maintaining SR. Decrease Toprol to 12.5 mg HS to help with fatigue.  Continue Eliquis 5 mg BID  2. Cardiomyopathy EF normalized on recent echo, suspect tachycardia mediated.   3. Secondary hypercoagulable state This patients CHA2DS2-VASc Score and unadjusted Ischemic Stroke Rate (% per year) is equal to 3.2 % stroke rate/year from a score of 3 Continue Eliquis.     Follow up with Dr Cathy Lambert per recall. AF clinic in 6 months.    Cathy Lambert [facility] [address] [place], [place] [address] [phone] 12/14/2019 11:10 AM

## 2019-12-14 NOTE — Patient Instructions (Signed)
Decrease Metoprolol by mouth to 1/2 tablet daily

## 2020-01-01 ENCOUNTER — Other Ambulatory Visit (HOSPITAL_COMMUNITY): Payer: Self-pay | Admitting: Nurse Practitioner

## 2020-01-25 ENCOUNTER — Telehealth (HOSPITAL_COMMUNITY): Payer: Self-pay | Admitting: *Deleted

## 2020-01-25 ENCOUNTER — Other Ambulatory Visit (HOSPITAL_COMMUNITY): Payer: Self-pay | Admitting: *Deleted

## 2020-01-25 MED ORDER — METOPROLOL SUCCINATE ER 25 MG PO TB24: 25.0000 mg | ORAL_TABLET | Freq: Every day | ORAL | 2 refills | Status: DC

## 2020-01-25 NOTE — Telephone Encounter (Signed)
Pt called in stating she felt no different on decreased dose of metoprolol. She would prefer to go back to 25mg  a day as the tablet is very difficult to cut. RX sent for the 25mg  tablet per pt request.

## 2020-01-31 DIAGNOSIS — Z0120 Encounter for dental examination and cleaning without abnormal findings: Secondary | ICD-10-CM | POA: Diagnosis not present

## 2020-02-24 ENCOUNTER — Encounter: Payer: Self-pay | Admitting: Family Medicine

## 2020-02-25 ENCOUNTER — Encounter: Payer: Self-pay | Admitting: Family Medicine

## 2020-02-25 ENCOUNTER — Other Ambulatory Visit: Payer: Self-pay

## 2020-02-25 ENCOUNTER — Telehealth: Payer: Self-pay

## 2020-02-25 MED ORDER — LORAZEPAM 2 MG PO TABS: 1.0000 mg | ORAL_TABLET | Freq: Every evening | ORAL | 3 refills | Status: DC | PRN

## 2020-02-25 NOTE — Telephone Encounter (Signed)
Ok to refill meds. And please call to clarify what she is asking about knee. I don't understand what she means "share reference". Thanks.

## 2020-02-25 NOTE — Telephone Encounter (Signed)
That's fine

## 2020-02-25 NOTE — Telephone Encounter (Signed)
Patient called trying to schedule an appt for her knee pain, but theres not an opening til next Friday. She would like to be seen earlier due to her pain. Could I schedule her in the [facility] for Monday?

## 2020-02-28 ENCOUNTER — Ambulatory Visit (INDEPENDENT_AMBULATORY_CARE_PROVIDER_SITE_OTHER): Payer: Medicare Other | Admitting: Family Medicine

## 2020-02-28 ENCOUNTER — Other Ambulatory Visit: Payer: Self-pay

## 2020-02-28 ENCOUNTER — Encounter: Payer: Self-pay | Admitting: Family Medicine

## 2020-02-28 VITALS — BP 132/70 | Ht 66.0 in | Wt 180.0 lb

## 2020-02-28 DIAGNOSIS — M25562 Pain in left knee: Secondary | ICD-10-CM

## 2020-02-28 DIAGNOSIS — Z7901 Long term (current) use of anticoagulants: Secondary | ICD-10-CM | POA: Diagnosis not present

## 2020-02-28 DIAGNOSIS — M25561 Pain in right knee: Secondary | ICD-10-CM

## 2020-02-28 DIAGNOSIS — G8929 Other chronic pain: Secondary | ICD-10-CM

## 2020-02-28 NOTE — Progress Notes (Signed)
Subjective  CC:  Chief Complaint  Patient presents with  . Knee Pain    Bilateral knee pain. She is more concerned about her left knee. Pain comes and goes. She has taken otc tylenol with no relieve.     HPI: Cathy Lambert is a 86 y.o. female who presents to the office today to address the problems listed above in the chief complaint.   86 year old presents due to worsening knee pain.  In the past, on occasion, we have discussed the possibility of knee osteoarthritis in the right knee due to her symptoms of anterior knee pain intermittently.  She reports this still occurs but describes it only as a twinge and not very bothersome.  However, over the last week she has experienced significant left knee pain typically in the anterior knee.  It was so bad that she had to be nonweightbearing for 2 days this week.  She describes moderate to severe pain with walking.  She feels fine when she is sitting upright with her knee in a 90 degree flexed angle.  She has not noticed any swelling.  She denies recent overuse or trauma.  She has not taken any over-the-counter medications for pain.  This is the first time her left knee has been problematic.  She did go to the pool and do some water exercises and today her knee is feeling better.  She has some pain with walking but no limp.  She denies calf pain, ankle pain or foot pain.  Her knees have not been x-rayed.  I reviewed the patients updated PMH, FH, and SocHx.    Patient Active Problem List   Diagnosis Date Noted  . White coat syndrome without diagnosis of hypertension 10/25/2019    Priority: High  . Chronic prescription benzodiazepine use 05/25/2019    Priority: High  . Current use of long term anticoagulation 05/25/2019    Priority: High  . Osteoporosis 05/10/2019    Priority: High  . Atrial fibrillation (HCC)     Priority: High  . Wide-complex tachycardia (HCC) 03/04/2019    Priority: High  . Acquired hypothyroidism 10/27/2017     Priority: High  . Primary insomnia 05/25/2019    Priority: Medium  . Peripheral neuropathy, idiopathic 06/24/2018    Priority: Medium  . Primary osteoarthritis of knee     Priority: Medium  . Patellofemoral pain syndrome of right knee 06/24/2018    Priority: Low  . History of migraine - none since age 56 10/27/2017    Priority: Low  . Secondary hypercoagulable state (HCC) 12/14/2019  . Atrial flutter (HCC) 09/26/2019   Current Meds  Medication Sig  . B Complex Vitamins (VITAMIN B COMPLEX PO) Take 1 tablet by mouth daily with breakfast.   . calcium citrate-vitamin D (CITRACAL+D) 315-200 MG-UNIT tablet Take 1 tablet by mouth daily.  . Cholecalciferol (VITAMIN D-3 PO) Take 1 capsule by mouth daily with breakfast.  . ELIQUIS 5 MG TABS tablet TAKE 1 TABLET(5 MG) BY MOUTH TWICE DAILY  . levothyroxine (SYNTHROID) 50 MCG tablet TAKE 1 TABLET BY MOUTH IN THE MORNING BEFORE BREAKFAST ON SUNDAY/MONDAY/WEDNESDAY/FRIDAY/SATURDAY AND 2 TABLETS ON TUESDAYS AND THURSDAYS  . LORazepam (ATIVAN) 2 MG tablet Take 0.5 tablets (1 mg total) by mouth at bedtime as needed for sleep.  . Magnesium 500 MG TABS Take 500 mg by mouth at bedtime.  . metoprolol succinate (TOPROL-XL) 25 MG 24 hr tablet Take 1 tablet (25 mg total) by mouth at bedtime.  . Probiotic  Product (PROBIOTIC DAILY PO) Take 1 capsule by mouth daily.     Allergies: Patient has No Known Allergies.  Review of Systems: Constitutional: Negative for fever malaise or anorexia Cardiovascular: negative for chest pain Respiratory: negative for SOB or persistent cough Gastrointestinal: negative for abdominal pain  Objective  Vitals: BP 132/70   Ht 5\' 6"  (1.676 m)   Wt 180 lb (81.6 kg)   BMI 29.05 kg/m  General: no acute distress , A&Ox3 Cardiovascular:  RRR without murmur  Knee:  Right knee with minimal ttp, no effusion, from; LEFT KNEE: Small effusion present, crepitus, full range of motion but pain with full extension and full flexion, medial  and joint line tenderness present, pain with McMurray's testing but no clunking noted, no laxity No calf tenderness or lower extremity swelling, no cords Skin:  Warm, no rashes  Procedure note for Knee Joint Aspiration and/or Injection:  Indications: pain relief, failed conservative therapies  Knee Arthrocentesis with Injection Procedure Note  Pre-operative Diagnosis: left knee pain with internal derangement and effusion  Post-operative Diagnosis: same  Indications: Unexplained joint effusion and Symptom relief from osteoarthritis  Anesthesia: Lidocaine 1% without epinephrine without added sodium bicarbonate  Procedure Details   Verbal consent was obtained for the procedure. Universal time out taken.  The Knee joint was prepped with alcohol and an 18 gauge needle was inserted into the joint from the lateral approach.  Four ml 1% lidocaine and one ml of triamcinolone (KENALOG) 40mg /ml was then injected into the joint through the same needle. The needle was removed and the area cleansed and dressed.  Complications:  None; patient tolerated the procedure well.   Assessment  1. Acute pain of left knee   2. Age-related osteoporosis without current pathological fracture   3. Chronic pain of right knee   4. Current use of long term anticoagulation      Plan    Left greater than right knee pain: Suspect osteoarthritis present but also could be meniscal injury or other.  Steroid injection for pain relief, ice and Tylenol as needed.  Check x-rays.  Further evaluation if symptoms do not improving over the next few weeks.  S/p intra-articular knee joint steroid injection: Patient prefers not to take oral medications, risk and benefits of steroid injection given and patient elected to proceed.  Routine post procedure care discussed. Rest, Ice, Nsaids as needed and ROM exercises recommended. F/u care printed out for patient in AVS.   Discussed urgent f/u if develops increased pain, redness or  fever.   Follow up: As needed, if symptoms do not improve   Commons side effects, risks, benefits, and alternatives for medications and treatment plan prescribed today were discussed, and the patient expressed understanding of the given instructions. Patient is instructed to call or message via MyChart if he/she has any questions or concerns regarding our treatment plan. No barriers to understanding were identified. We discussed Red Flag symptoms and signs in detail. Patient expressed understanding regarding what to do in case of urgent or emergency type symptoms.   Medication list was reconciled, printed and provided to the patient in AVS. Patient instructions and summary information was reviewed with the patient as documented in the AVS. This note was prepared with assistance of Dragon voice recognition software. Occasional wrong-word or sound-a-like substitutions may have occurred due to the inherent limitations of voice recognition software  No orders of the defined types were placed in this encounter.  No orders of the defined types were placed in  this encounter.

## 2020-02-28 NOTE — Patient Instructions (Signed)
Please return as scheduled.  04/27/2020  If you have any questions or concerns, please don't hesitate to send me a message via MyChart or call the office at [phone]. Thank you for visiting with [place] today! It's our pleasure caring for you.  Please go to our [facility] office to get your xrays done. You can walk in M-F between 8:30am- noon or 1pm - 5pm. Tell them you are there for xrays ordered by me. They will send me the results, then I will let you know the results with instructions.   Address: [address]  The Xray department is located in the basement.   You had a steroid injection today.   Things to be aware of after this injection are listed below:  You may experience no significant improvement or even a slight worsening in your symptoms during the first 24 to 48 hours.  After that we expect your symptoms to improve gradually over the next 2 weeks for the medicine to have its maximal effect.  You should continue to have improvement out to 6 weeks after your injection.  I recommend icing the site of the injection for 20 minutes  1-2 times the day of your injection  You may shower but no swimming, tub bath or Jacuzzi for 24 hours.  If your bandage falls off this does not need to be replaced.  It is appropriate to remove the bandage after 4 hours.  You may resume light activities as tolerated.     POSSIBLE PROCEDURE SIDE EFFECTS: The side effects of the injection are usually fairly minimal however if you may experience some of the following side effects that are usually self-limited and will is off on their own.  If you are concerned please feel free to call the office with questions:             Increased numbness or tingling             Nausea or vomiting             Swelling or bruising at the injection site    Please call our office if if you experience any of the following symptoms over the next week as these can be signs of infection:              Fever  greater than 100.10F             Significant swelling at the injection site             Significant redness or drainage from the injection site

## 2020-02-29 ENCOUNTER — Encounter: Payer: Self-pay | Admitting: Family Medicine

## 2020-02-29 MED ORDER — TRIAMCINOLONE ACETONIDE 40 MG/ML IJ SUSP: 40.0000 mg | Freq: Once | INTRAMUSCULAR | Status: DC

## 2020-02-29 NOTE — Addendum Note (Signed)
Addended by: Manuela Schwartz on: 02/29/2020 04:24 PM   Modules accepted: Orders

## 2020-03-02 ENCOUNTER — Other Ambulatory Visit: Payer: Self-pay

## 2020-03-02 ENCOUNTER — Ambulatory Visit (INDEPENDENT_AMBULATORY_CARE_PROVIDER_SITE_OTHER)
Admission: RE | Admit: 2020-03-02 | Discharge: 2020-03-02 | Disposition: A | Discharge status: Home / Self Care | Payer: Medicare Other | Source: Ambulatory Visit | Attending: Family Medicine | Admitting: Family Medicine

## 2020-03-02 DIAGNOSIS — M25562 Pain in left knee: Secondary | ICD-10-CM | POA: Diagnosis not present

## 2020-03-02 DIAGNOSIS — M1712 Unilateral primary osteoarthritis, left knee: Secondary | ICD-10-CM | POA: Diagnosis not present

## 2020-03-02 DIAGNOSIS — M25561 Pain in right knee: Secondary | ICD-10-CM

## 2020-03-02 DIAGNOSIS — G8929 Other chronic pain: Secondary | ICD-10-CM | POA: Diagnosis not present

## 2020-03-02 DIAGNOSIS — M1711 Unilateral primary osteoarthritis, right knee: Secondary | ICD-10-CM | POA: Diagnosis not present

## 2020-03-03 ENCOUNTER — Other Ambulatory Visit (HOSPITAL_COMMUNITY): Payer: Self-pay

## 2020-03-03 MED ORDER — METOPROLOL SUCCINATE ER 25 MG PO TB24: 25.0000 mg | ORAL_TABLET | Freq: Every day | ORAL | 2 refills | Status: DC

## 2020-03-13 ENCOUNTER — Other Ambulatory Visit: Payer: Self-pay

## 2020-03-13 ENCOUNTER — Encounter: Payer: Self-pay | Admitting: Internal Medicine

## 2020-03-13 ENCOUNTER — Ambulatory Visit: Payer: Medicare Other | Admitting: Internal Medicine

## 2020-03-13 VITALS — BP 138/86 | HR 77 | Temp 97.7°F | Ht 66.0 in | Wt 173.0 lb

## 2020-03-13 DIAGNOSIS — Z7901 Long term (current) use of anticoagulants: Secondary | ICD-10-CM

## 2020-03-13 DIAGNOSIS — R5383 Other fatigue: Secondary | ICD-10-CM

## 2020-03-13 DIAGNOSIS — D6869 Other thrombophilia: Secondary | ICD-10-CM | POA: Diagnosis not present

## 2020-03-13 DIAGNOSIS — E785 Hyperlipidemia, unspecified: Secondary | ICD-10-CM

## 2020-03-13 DIAGNOSIS — I4892 Unspecified atrial flutter: Secondary | ICD-10-CM

## 2020-03-13 DIAGNOSIS — E039 Hypothyroidism, unspecified: Secondary | ICD-10-CM

## 2020-03-13 MED ORDER — DILTIAZEM HCL ER COATED BEADS 120 MG PO CP24: 120.0000 mg | ORAL_CAPSULE | Freq: Every day | ORAL | 11 refills | Status: DC

## 2020-03-13 NOTE — Patient Instructions (Signed)
Medication Instructions:  STOP- Metoprolol 25 mg daily START- Diltiazem 120 mg daily  *If you need a refill on your cardiac medications before your next appointment, please call your pharmacy*   Lab Work: None  Testing/Procedures: None   Follow-Up: At [facility], you and your health needs are our priority.  As part of our continuing mission to provide you with exceptional heart care, we have created designated Provider Care Teams.  These Care Teams include your primary Cardiologist (physician) and Advanced Practice Providers (APPs -  Physician Assistants and Nurse Practitioners) who all work together to provide you with the care you need, when you need it.  We recommend signing up for the patient portal called "MyChart".  Sign up information is provided on this After Visit Summary.  MyChart is used to connect with patients for Virtual Visits (Telemedicine).  Patients are able to view lab/test results, encounter notes, upcoming appointments, etc.  Non-urgent messages can be sent to your provider as well.   To learn more about what you can do with MyChart, go to [email or web address].    Your next appointment:   6 month(s)  The format for your next appointment:   In Person  Provider:   Weston Brass, MD

## 2020-03-13 NOTE — Progress Notes (Signed)
Cardiology Office Note:    Date:  03/13/2020   ID:  Cathy Lambert, Cathy Lambert [birth year], MRN [id number]  PCP:  Willow Ora, MD  Cardiologist:  Parke Poisson, MD  Electrophysiologist:  None   Referring MD: Willow Ora, MD   Chief Complaint: atrial flutter  History of Present Illness:    Cathy Lambert is a 86 y.o. female with a history of hypothyroidism, migraines, and atrial flutter.  She presents today for follow-up of atrial flutter.  She is also followed by the atrial fibrillation clinic, [facility] [place], [place].  She initially presented to the hospital March 04, 2019 with symptoms of racing heart and heart rates in the 140s.  Primary symptom was fatigue.  She underwent TEE cardioversion after being loaded on IV amiodarone, however amiodarone was stopped due to thyroid panel abnormalities.  She has maintained sinus rhythm.  She does endorse episodes of palpitations.  During these episodes she will drink ice water and place ice packs on her chest, and within about 30 min symptoms improved.  She is overall feeling well, and we continue to discuss natural history of atrial fibrillation as well as rate and rhythm control strategies.  She denies chest pain or shortness of breath.  She does endorse fatigue since her hospitalization, and we discussed that this may be related to beta-blockade.  We discussed choice of beta-blocker versus calcium channel blocker.  We discussed transitioning to calcium channel blocker.  No contraindications that I am aware of. Past Medical History:  Diagnosis Date   Atrial fibrillation (HCC)    History of migraine - none since age 51 10/27/2017   Osteoporosis 05/10/2019   dexa 04/2019, lowest T = -2.6 at hip. To discuss 10/20 and offer tx.   Primary osteoarthritis of knee    Thyroid disease     Past Surgical History:  Procedure Laterality Date   BUNIONECTOMY  1991   CARDIOVERSION N/A 03/08/2019   Procedure: CARDIOVERSION;  Surgeon: Elease Hashimoto Deloris Ping, MD;   Location: [facility] ENDOSCOPY;  Service: Cardiovascular;  Laterality: N/A;   CATARACT EXTRACTION  2008-2009   TEE WITHOUT CARDIOVERSION N/A 03/08/2019   Procedure: TRANSESOPHAGEAL ECHOCARDIOGRAM (TEE);  Surgeon: Vesta Mixer, MD;  Location: [facility] ENDOSCOPY;  Service: Cardiovascular;  Laterality: N/A;   TONSILLECTOMY AND ADENOIDECTOMY  1953    Current Medications: Current Meds  Medication Sig   B Complex Vitamins (VITAMIN B COMPLEX PO) Take 1 tablet by mouth daily with breakfast.    calcium citrate-vitamin D (CITRACAL+D) 315-200 MG-UNIT tablet Take 1 tablet by mouth daily.   Cholecalciferol (VITAMIN D-3 PO) Take 1 capsule by mouth daily with breakfast.   ELIQUIS 5 MG TABS tablet TAKE 1 TABLET(5 MG) BY MOUTH TWICE DAILY   levothyroxine (SYNTHROID) 50 MCG tablet TAKE 1 TABLET BY MOUTH IN THE MORNING BEFORE BREAKFAST ON SUNDAY/MONDAY/WEDNESDAY/FRIDAY/SATURDAY AND 2 TABLETS ON TUESDAYS AND THURSDAYS   LORazepam (ATIVAN) 2 MG tablet Take 0.5 tablets (1 mg total) by mouth at bedtime as needed for sleep.   Magnesium 500 MG TABS Take 500 mg by mouth at bedtime.   Probiotic Product (PROBIOTIC DAILY PO) Take 1 capsule by mouth daily.    [DISCONTINUED] metoprolol succinate (TOPROL-XL) 25 MG 24 hr tablet Take 1 tablet (25 mg total) by mouth at bedtime.   Current Facility-Administered Medications for the 03/13/20 encounter (Office Visit) with Parke Poisson, MD  Medication   triamcinolone acetonide (KENALOG-40) injection 40 mg     Allergies:   Patient has no known  allergies.   Social History   Socioeconomic History   Marital status: Divorced    Spouse name: Not on file   Number of children: Not on file   Years of education: Not on file   Highest education level: Not on file  Occupational History   Occupation: Retired     Comment: Warden/ranger   Tobacco Use   Smoking status: Never Smoker   Smokeless tobacco: Never Used  Building services engineer Use: Never used  Substance and  Sexual Activity   Alcohol use: Yes    Alcohol/week: 1.0 standard drink    Types: 1 Cans of beer per week    Comment: once a month   Drug use: No   Sexual activity: Never    Birth control/protection: Post-menopausal  Other Topics Concern   Not on file  Social History Narrative   Enjoys spending time with neighbors    Social Determinants of Health   Financial Resource Strain:    Difficulty of Paying Living Expenses:   Food Insecurity:    Worried About Programme researcher, broadcasting/film/video in the Last Year:    Barista in the Last Year:   Transportation Needs:    Freight forwarder (Medical):    Lack of Transportation (Non-Medical):   Physical Activity:    Days of Exercise per Week:    Minutes of Exercise per Session:   Stress:    Feeling of Stress :   Social Connections:    Frequency of Communication with Friends and Family:    Frequency of Social Gatherings with Friends and Family:    Attends Religious Services:    Active Member of Clubs or Organizations:    Attends Engineer, structural:    Marital Status:      Family History: The patient's family history includes Arthritis in her mother; Cancer in her maternal grandmother, mother, paternal grandfather, sister, and son; Diabetes in her brother; Hearing loss in her sister; Stroke in her maternal grandfather.  ROS:   Please see the history of present illness.    All other systems reviewed and are negative.  EKGs/Labs/Other Studies Reviewed:    The following studies were reviewed today:  EKG:  NSR Recent Labs: 10/25/2019: ALT 20; BUN 20; Creatinine, Ser 1.10; Hemoglobin 14.9; Platelets 265.0; Potassium 4.4; Sodium 138; TSH 3.24  Recent Lipid Panel    Component Value Date/Time   CHOL 199 10/25/2019 1002   CHOL 220 (H) 03/26/2019 0906   TRIG 78.0 10/25/2019 1002   HDL 66.90 10/25/2019 1002   HDL 76 03/26/2019 0906   CHOLHDL 3 10/25/2019 1002   VLDL 15.6 10/25/2019 1002   LDLCALC 116 (H)  10/25/2019 1002   LDLCALC 127 (H) 03/26/2019 0906    Physical Exam:    VS:  BP (!) 138/86    Pulse 77    Temp 97.7 F (36.5 C)    Ht 5\' 6"  (1.676 m)    Wt 173 lb (78.5 kg)    SpO2 95%    BMI 27.92 kg/m     Wt Readings from Last 5 Encounters:  03/13/20 173 lb (78.5 kg)  02/28/20 180 lb (81.6 kg)  12/14/19 182 lb (82.6 kg)  10/25/19 179 lb 7.3 oz (81.4 kg)  10/25/19 179 lb 6.4 oz (81.4 kg)     Constitutional: No acute distress Eyes: sclera non-icteric, normal conjunctiva and lids ENMT: normal dentition, moist mucous membranes Cardiovascular: regular rhythm, normal rate, no murmurs. S1 and S2 normal.  Radial pulses normal bilaterally. No jugular venous distention.  Respiratory: clear to auscultation bilaterally GI : normal bowel sounds, soft and nontender. No distention.   MSK: extremities warm, well perfused. No edema.  NEURO: grossly nonfocal exam, moves all extremities. PSYCH: alert and oriented x 3, normal mood and affect.   ASSESSMENT:    1. Atrial flutter, unspecified type (HCC)   2. Secondary hypercoagulable state (HCC)   3. Acquired hypothyroidism   4. Hyperlipidemia, unspecified hyperlipidemia type   5. Long term (current) use of anticoagulants   6. Fatigue, unspecified type    PLAN:    Atrial flutter, unspecified type (HCC) - Plan: EKG 12-Lead, diltiazem (CARDIZEM CD) 120 MG 24 hr capsule  Secondary hypercoagulable state (HCC)  Acquired hypothyroidism  Hyperlipidemia, unspecified hyperlipidemia type  Long term (current) use of anticoagulants  Fatigue, unspecified type   She is maintaining sinus rhythm and is currently only on rate control strategy with metoprolol.  However she endorses fatigue since her hospitalization which may be attributable to metoprolol.  We discussed in detail transitioning to diltiazem 120 mg long-acting daily.  We will monitor effects for a month.  If she does not tolerate this therapy I have instructed her to return to her usual dose  of metoprolol and discontinue diltiazem and notify our office.  She will follow up with A. fib clinic in 3 months and can assess symptoms at that time.  She continues on apixaban 5 mg twice daily without bleeding event.  Hyperlipidemia-she is attempting diet control of hyperlipidemia and describes to me her weight loss strategy as well as calorie restriction.  We have reviewed this and it seems appropriate.  She can continue with this weight loss strategy.  Total time of encounter: 38 minutes total time of encounter, including 25 minutes spent in face-to-face patient care on the date of this encounter. This time includes coordination of care and counseling regarding above mentioned problem list. Remainder of non-face-to-face time involved reviewing chart documents/testing relevant to the patient encounter and documentation in the medical record. I have independently reviewed documentation from referring provider.   Weston BrassGayatri Elliannah Wayment, MD [place]   [facility]    Medication Adjustments/Labs and Tests Ordered: Current medicines are reviewed at length with the patient today.  Concerns regarding medicines are outlined above.  Orders Placed This Encounter  Procedures   EKG 12-Lead   Meds ordered this encounter  Medications   diltiazem (CARDIZEM CD) 120 MG 24 hr capsule    Sig: Take 1 capsule (120 mg total) by mouth daily.    Dispense:  30 capsule    Refill:  11    Patient Instructions  Medication Instructions:  STOP- Metoprolol 25 mg daily START- Diltiazem 120 mg daily  *If you need a refill on your cardiac medications before your next appointment, please call your pharmacy*   Lab Work: None  Testing/Procedures: None   Follow-Up: [facility], you and your health needs are our priority.  As part of our continuing mission to provide you with exceptional heart care, we have created designated Provider Care Teams.  These Care Teams include your primary Cardiologist  (physician) and Advanced Practice Providers (APPs -  Physician Assistants and Nurse Practitioners) who all work together to provide you with the care you need, when you need it.  We recommend signing up for the patient portal called "MyChart".  Sign up information is provided on this After Visit Summary.  MyChart is used to connect with patients for Virtual  Visits (Telemedicine).  Patients are able to view lab/test results, encounter notes, upcoming appointments, etc.  Non-urgent messages can be sent to your provider as well.   To learn more about what you can do with MyChart, go to [email or web address].    Your next appointment:   6 month(s)  The format for your next appointment:   In Person  Provider:   Weston Brass, MD

## 2020-04-27 ENCOUNTER — Ambulatory Visit (INDEPENDENT_AMBULATORY_CARE_PROVIDER_SITE_OTHER): Payer: Medicare Other | Admitting: Family Medicine

## 2020-04-27 ENCOUNTER — Encounter: Payer: Self-pay | Admitting: Family Medicine

## 2020-04-27 ENCOUNTER — Other Ambulatory Visit: Payer: Self-pay

## 2020-04-27 VITALS — BP 128/68 | HR 81 | Temp 97.7°F | Resp 18 | Ht 66.0 in | Wt 179.2 lb

## 2020-04-27 DIAGNOSIS — R030 Elevated blood-pressure reading, without diagnosis of hypertension: Secondary | ICD-10-CM | POA: Diagnosis not present

## 2020-04-27 DIAGNOSIS — Z23 Encounter for immunization: Secondary | ICD-10-CM | POA: Diagnosis not present

## 2020-04-27 DIAGNOSIS — R5383 Other fatigue: Secondary | ICD-10-CM

## 2020-04-27 DIAGNOSIS — F5101 Primary insomnia: Secondary | ICD-10-CM

## 2020-04-27 DIAGNOSIS — I4892 Unspecified atrial flutter: Secondary | ICD-10-CM | POA: Diagnosis not present

## 2020-04-27 DIAGNOSIS — Z7901 Long term (current) use of anticoagulants: Secondary | ICD-10-CM

## 2020-04-27 DIAGNOSIS — E039 Hypothyroidism, unspecified: Secondary | ICD-10-CM | POA: Diagnosis not present

## 2020-04-27 DIAGNOSIS — M170 Bilateral primary osteoarthritis of knee: Secondary | ICD-10-CM

## 2020-04-27 LAB — CBC WITH DIFFERENTIAL/PLATELET
Absolute Monocytes: 504 cells/uL (ref 200–950)
Basophils Absolute: 44 cells/uL (ref 0–200)
Basophils Relative: 0.6 %
Eosinophils Absolute: 270 cells/uL (ref 15–500)
Eosinophils Relative: 3.7 %
HCT: 44.4 % (ref 35.0–45.0)
Hemoglobin: 14.6 g/dL (ref 11.7–15.5)
Lymphs Abs: 2562 cells/uL (ref 850–3900)
MCH: 30.2 pg (ref 27.0–33.0)
MCHC: 32.9 g/dL (ref 32.0–36.0)
MCV: 91.7 fL (ref 80.0–100.0)
MPV: 9.9 fL (ref 7.5–12.5)
Monocytes Relative: 6.9 %
Neutro Abs: 3920 cells/uL (ref 1500–7800)
Neutrophils Relative %: 53.7 %
Platelets: 304 10*3/uL (ref 140–400)
RBC: 4.84 10*6/uL (ref 3.80–5.10)
RDW: 14.2 % (ref 11.0–15.0)
Total Lymphocyte: 35.1 %
WBC: 7.3 10*3/uL (ref 3.8–10.8)

## 2020-04-27 LAB — COMPLETE METABOLIC PANEL WITH GFR
AG Ratio: 1.6 (calc) (ref 1.0–2.5)
ALT: 26 U/L (ref 6–29)
AST: 21 U/L (ref 10–35)
Albumin: 4.1 g/dL (ref 3.6–5.1)
Alkaline phosphatase (APISO): 64 U/L (ref 37–153)
BUN/Creatinine Ratio: 18 (calc) (ref 6–22)
BUN: 19 mg/dL (ref 7–25)
CO2: 26 mmol/L (ref 20–32)
Calcium: 9.3 mg/dL (ref 8.6–10.4)
Chloride: 105 mmol/L (ref 98–110)
Creat: 1.04 mg/dL — ABNORMAL HIGH (ref 0.60–0.88)
GFR, Est African American: 56 mL/min/{1.73_m2} — ABNORMAL LOW (ref >=60)
GFR, Est Non African American: 49 mL/min/{1.73_m2} — ABNORMAL LOW (ref >=60)
Globulin: 2.5 g/dL (calc) (ref 1.9–3.7)
Glucose, Bld: 101 mg/dL — ABNORMAL HIGH (ref 65–99)
Potassium: 4.3 mmol/L (ref 3.5–5.3)
Sodium: 139 mmol/L (ref 135–146)
Total Bilirubin: 0.7 mg/dL (ref 0.2–1.2)
Total Protein: 6.6 g/dL (ref 6.1–8.1)

## 2020-04-27 LAB — TSH: TSH: 1.98 mIU/L (ref 0.40–4.50)

## 2020-04-27 NOTE — Patient Instructions (Signed)
Please return in 6 months for your annual complete physical; please come fasting.  I will release your lab results to you on your MyChart account with further instructions. Please reply with any questions.  Today you were given your High dose flu vaccination.   If you have any questions or concerns, please don't hesitate to send me a message via MyChart or call the office at [phone]. Thank you for visiting with [place] today! It's our pleasure caring for you.

## 2020-04-27 NOTE — Progress Notes (Signed)
Subjective  CC:  Chief Complaint  Patient presents with  . Hypothyroidism    Complains of fatigue and low energy  . Health Maintenance    Mammogram hasn't been done. Flu shot in office today.   . Osteoporosis    She would like to discuss her calcium and vitamin supplements.     HPI: Cathy Lambert is a 86 y.o. female who presents to the office today to address the problems listed above in the chief complaint.  Cathy Lambert presents for 68-month follow-up of medical problems.  I reviewed recent cardiology notes.  Hypothyroidism: Control has been good.  Last checked 6 months ago.  However complains of persistent fatigue.  She has been intolerant to the beta-blocker for her A. fib this was recently changed gastrojejunal blocker.  She believes her energy is mildly improved but still struggles with physical activities.  She denies symptoms of depression including apathy or low motivation.  She enjoys activities.  Finds it difficult her to get going she denies palpitations, chest pain, lower extremity edema, orthopnea or dyspnea.  Long history of whitecoat syndrome but today her blood pressure is normal in the office.  She is happy about this.  A flutter with history of A. fib on Eliquis and tolerating well.  Rate control transfer to a walker.  Primary insomnia chronic benzos: Stable  She was given a steroid injection to her left knee in July.  She reports excellent results, no longer having pain in the knee.  Flu shot due today   Assessment  1. Acquired hypothyroidism   2. White coat syndrome without diagnosis of hypertension   3. Current use of long term anticoagulation   4. Atrial flutter, unspecified type (HCC)   5. Primary insomnia   6. Primary osteoarthritis of both knees   7. Fatigue, unspecified type   8. Need for influenza vaccination      Plan   Hypothyroidism: Given symptoms of fatigue will recheck to ensure it is stable.  She is compliant with her  medications.  Whitecoat hypertension: Well in office today.  Continue home monitoring.  Home readings remain.  Chronic anticoagulation for atrial fibrillation atrial flutter: Stable without adverse effects.  Monitor by cardiology.  Chronic benzo use.  Stable.  Patient states this is benefits.  Osteoarthritis with excellent results from recent steroid injection.  Quad strengthening exercises  Continue: Check other lab work to check liver etiology but suspect beta-blocker or calcium channel  Flu shot updated today.  Other health maintenance things are up-to-date.  No longer needs mammograms at her age.  Follow up: Return in about 6 months (around 10/25/2020) for complete physical.  Visit date not found  Orders Placed This Encounter  Procedures  . Flu Vaccine QUAD High Dose(Fluad)  . CBC with Differential/Platelet  . TSH  . COMPLETE METABOLIC PANEL WITH GFR   No orders of the defined types were placed in this encounter.     I reviewed the patients updated PMH, FH, and SocHx.    Patient Active Problem List   Diagnosis Date Noted  . White coat syndrome without diagnosis of hypertension 10/25/2019    Priority: High  . Chronic prescription benzodiazepine use 05/25/2019    Priority: High  . Current use of long term anticoagulation 05/25/2019    Priority: High  . Osteoporosis 05/10/2019    Priority: High  . Atrial fibrillation (HCC)     Priority: High  . Wide-complex tachycardia (HCC) 03/04/2019    Priority: High  .  Acquired hypothyroidism 10/27/2017    Priority: High  . Primary insomnia 05/25/2019    Priority: Medium  . Peripheral neuropathy, idiopathic 06/24/2018    Priority: Medium  . Primary osteoarthritis of knee     Priority: Medium  . Patellofemoral pain syndrome of right knee 06/24/2018    Priority: Low  . History of migraine - none since age 49 10/27/2017    Priority: Low  . Secondary hypercoagulable state (HCC) 12/14/2019  . Atrial flutter (HCC) 09/26/2019    Current Meds  Medication Sig  . B Complex Vitamins (VITAMIN B COMPLEX PO) Take 1 tablet by mouth daily with breakfast.   . calcium citrate-vitamin D (CITRACAL+D) 315-200 MG-UNIT tablet Take 1 tablet by mouth in the morning and at bedtime.  [name] Kitchen diltiazem (CARDIZEM CD) 120 MG 24 hr capsule Take 1 capsule (120 mg total) by mouth daily.  [name] Kitchen ELIQUIS 5 MG TABS tablet TAKE 1 TABLET(5 MG) BY MOUTH TWICE DAILY  . levothyroxine (SYNTHROID) 50 MCG tablet TAKE 1 TABLET BY MOUTH IN THE MORNING BEFORE BREAKFAST ON SUNDAY/MONDAY/WEDNESDAY/FRIDAY/SATURDAY AND 2 TABLETS ON TUESDAYS AND THURSDAYS  . LORazepam (ATIVAN) 2 MG tablet Take 0.5 tablets (1 mg total) by mouth at bedtime as needed for sleep.  . magnesium hydroxide (MILK OF MAGNESIA) 400 MG/5ML suspension Take by mouth daily as needed for mild constipation.  . [DISCONTINUED] Cholecalciferol (VITAMIN D-3 PO) Take 1 capsule by mouth daily with breakfast.    Allergies: Patient has No Known Allergies. Family History: Patient family history includes Arthritis in her mother; Cancer in her maternal grandmother, mother, paternal grandfather, sister, and son; Diabetes in her brother; Hearing loss in her sister; Stroke in her maternal grandfather. Social History:  Patient  reports that she has never smoked. She has never used smokeless tobacco. She reports current alcohol use of about 1.0 standard drink of alcohol per week. She reports that she does not use drugs.  Review of Systems: Constitutional: Negative for fever malaise or anorexia Cardiovascular: negative for chest pain Respiratory: negative for SOB or persistent cough Gastrointestinal: negative for abdominal pain  Objective  Vitals: BP 128/68   Pulse 81   Temp 97.7 F (36.5 C) (Temporal)   Resp 18   Ht 5\' 6"  (1.676 m)   Wt 179 lb 3.2 oz (81.3 kg)   SpO2 96%   BMI 28.92 kg/m  General: no acute distress , A&Ox3 HEENT: PEERL, conjunctiva normal, neck is supple Cardiovascular:  RRR with  occasional extra beat without murmur or gallop.  No edema Respiratory:  Good breath sounds bilaterally, CTAB with normal respiratory effort Skin:  Warm, no rashes Neuro: Tremor     Commons side effects, risks, benefits, and alternatives for medications and treatment plan prescribed today were discussed, and the patient expressed understanding of the given instructions. Patient is instructed to call or message via MyChart if he/she has any questions or concerns regarding our treatment plan. No barriers to understanding were identified. We discussed Red Flag symptoms and signs in detail. Patient expressed understanding regarding what to do in case of urgent or emergency type symptoms.   Medication list was reconciled, printed and provided to the patient in AVS. Patient instructions and summary information was reviewed with the patient as documented in the AVS. This note was prepared with assistance of Dragon voice recognition software. Occasional wrong-word or sound-a-like substitutions may have occurred due to the inherent limitations of voice recognition software  This visit occurred during the SARS-CoV-2 public health emergency.  Safety protocols were  in place, including screening questions prior to the visit, additional usage of staff PPE, and extensive cleaning of exam room while observing appropriate contact time as indicated for disinfecting solutions.

## 2020-05-19 ENCOUNTER — Other Ambulatory Visit: Payer: Self-pay | Admitting: Family Medicine

## 2020-05-19 DIAGNOSIS — Z1231 Encounter for screening mammogram for malignant neoplasm of breast: Secondary | ICD-10-CM

## 2020-06-05 ENCOUNTER — Other Ambulatory Visit (HOSPITAL_COMMUNITY): Payer: Self-pay | Admitting: *Deleted

## 2020-06-05 DIAGNOSIS — I4892 Unspecified atrial flutter: Secondary | ICD-10-CM

## 2020-06-05 MED ORDER — DILTIAZEM HCL ER COATED BEADS 120 MG PO CP24: 120.0000 mg | ORAL_CAPSULE | Freq: Every day | ORAL | 6 refills | Status: DC

## 2020-06-12 ENCOUNTER — Other Ambulatory Visit: Payer: Self-pay

## 2020-06-12 ENCOUNTER — Telehealth: Payer: Self-pay

## 2020-06-12 MED ORDER — LEVOTHYROXINE SODIUM 50 MCG PO TABS: ORAL_TABLET | ORAL | 2 refills | Status: AC

## 2020-06-12 NOTE — Telephone Encounter (Signed)
Script sent to pharmacy.

## 2020-06-12 NOTE — Telephone Encounter (Signed)
..   LAST APPOINTMENT DATE: 04/27/2020   NEXT APPOINTMENT DATE:@3 /03/2021  MEDICATION:levothyroxine (SYNTHROID) 50 MCG tablet    PHARMACY:  **Let patient know to contact pharmacy at the end of the day to make sure medication is ready. **  ** Please notify patient to allow 48-72 hours to process**  **Encourage patient to contact the pharmacy for refills or they can request refills through [facility]**  CLINICAL FILLS OUT ALL BELOW:   LAST REFILL:  QTY:  REFILL DATE:    OTHER COMMENTS:    Okay for refill?  Please advise

## 2020-06-14 ENCOUNTER — Ambulatory Visit (HOSPITAL_COMMUNITY)
Admission: RE | Admit: 2020-06-14 | Discharge: 2020-06-14 | Disposition: A | Discharge status: Home / Self Care | Payer: Medicare Other | Source: Ambulatory Visit | Attending: Physician Assistant | Admitting: Physician Assistant

## 2020-06-14 ENCOUNTER — Other Ambulatory Visit: Payer: Self-pay

## 2020-06-14 VITALS — BP 134/76 | HR 80 | Ht 66.0 in | Wt 175.0 lb

## 2020-06-14 DIAGNOSIS — I4891 Unspecified atrial fibrillation: Secondary | ICD-10-CM | POA: Insufficient documentation

## 2020-06-14 DIAGNOSIS — Z7901 Long term (current) use of anticoagulants: Secondary | ICD-10-CM | POA: Insufficient documentation

## 2020-06-14 DIAGNOSIS — D6869 Other thrombophilia: Secondary | ICD-10-CM

## 2020-06-14 DIAGNOSIS — I351 Nonrheumatic aortic (valve) insufficiency: Secondary | ICD-10-CM | POA: Insufficient documentation

## 2020-06-14 DIAGNOSIS — Z79899 Other long term (current) drug therapy: Secondary | ICD-10-CM | POA: Insufficient documentation

## 2020-06-14 DIAGNOSIS — I4892 Unspecified atrial flutter: Secondary | ICD-10-CM | POA: Diagnosis not present

## 2020-06-14 NOTE — Progress Notes (Signed)
Primary Care Physician: Willow Ora, MD Primary Cardiologist: Dr Jacques Navy Primary Electrophysiologist: none Referring Physician: Dr [name] is a 86 y.o. female with a history of hypothyroidism, migraines and atrial flutter who presents for follow up in the [facility].  The patient was initially diagnosed with atrial flutter after presenting to the ER on 03/04/19 with symptoms of heart racing in the 140s Bpm. No other associated symptoms, but in hindsight she does note that she was more fatigued. There were no triggers that the patient could identify. She underwent TEE/DCCV after being loaded on IV amiodarone. Patient's amiodarone was stopped 2/2 abnormal thyroid panel. On follow up 05/2019 she was maintaining SR off AAD. Her EF normalized on echo 06/22/19.  On follow up today, patient reports she has done well since her last visit. Her BB was changed to diltiazem which has improved her symptoms of fatigue. She denies any palpitations or heart racing. She denies any bleeding issues on anticoagulation.   Today, she denies symptoms of palpitations, chest pain, shortness of breath, orthopnea, PND, lower extremity edema, dizziness, presyncope, syncope, snoring, daytime somnolence, bleeding, or neurologic sequela. The patient is tolerating medications without difficulties and is otherwise without complaint today.    Atrial Fibrillation Risk Factors:  she does not have symptoms or diagnosis of sleep apnea. she does not have a history of rheumatic fever. she does not have a history of alcohol use. The patient does not have a history of early familial atrial fibrillation or other arrhythmias.  she has a BMI of Body mass index is 28.25 kg/m.[name] Kitchen Filed Weights   06/14/20 1130  Weight: 79.4 kg    Family History  Problem Relation Age of Onset  . Arthritis Mother   . Cancer Mother   . Cancer Sister   . Diabetes Brother   . Cancer Maternal Grandmother    . Stroke Maternal Grandfather   . Cancer Paternal Grandfather   . Hearing loss Sister   . Cancer Son      Atrial Fibrillation Management history:  Previous antiarrhythmic drugs: amiodarone  Previous cardioversions: 03/08/19 Previous ablations: none CHADS2VASC score: 3 Anticoagulation history: Eliquis   Past Medical History:  Diagnosis Date  . Atrial fibrillation (HCC)   . History of migraine - none since age 79 10/27/2017  . Osteoporosis 05/10/2019   dexa 04/2019, lowest T = -2.6 at hip. To discuss 10/20 and offer tx.  . Primary osteoarthritis of knee   . Thyroid disease    Past Surgical History:  Procedure Laterality Date  . BUNIONECTOMY  1991  . CARDIOVERSION N/A 03/08/2019   Procedure: CARDIOVERSION;  Surgeon: Elease Hashimoto Deloris Ping, MD;  Location: [facility] ENDOSCOPY;  Service: Cardiovascular;  Laterality: N/A;  . CATARACT EXTRACTION  2008-2009  . TEE WITHOUT CARDIOVERSION N/A 03/08/2019   Procedure: TRANSESOPHAGEAL ECHOCARDIOGRAM (TEE);  Surgeon: Elease Hashimoto Deloris Ping, MD;  Location: [facility] ENDOSCOPY;  Service: Cardiovascular;  Laterality: N/A;  . TONSILLECTOMY AND ADENOIDECTOMY  1953    Current Outpatient Medications  Medication Sig Dispense Refill  . acetaminophen (TYLENOL) 500 MG tablet Take 500 mg by mouth as needed.    . B Complex Vitamins (VITAMIN B COMPLEX PO) Take 1 tablet by mouth daily with breakfast.     . calcium citrate-vitamin D (CITRACAL+D) 315-200 MG-UNIT tablet Take 1 tablet by mouth in the morning and at bedtime.    [name] Kitchen diltiazem (CARDIZEM CD) 120 MG 24 hr capsule Take 1 capsule (120 mg total) by  mouth daily. 30 capsule 6  . ELIQUIS 5 MG TABS tablet TAKE 1 TABLET(5 MG) BY MOUTH TWICE DAILY 180 tablet 1  . levothyroxine (SYNTHROID) 50 MCG tablet TAKE 1 TABLET BY MOUTH IN THE MORNING BEFORE BREAKFAST ON SUNDAY/MONDAY/WEDNESDAY/FRIDAY/SATURDAY AND 2 TABLETS ON TUESDAYS AND THURSDAYS 450 tablet 2  . LORazepam (ATIVAN) 2 MG tablet Take 0.5 tablets (1 mg total) by mouth at bedtime  as needed for sleep. 30 tablet 3  . Magnesium 400 MG TABS Take 1 tablet by mouth daily. Magnesium Glycinate    . metoprolol succinate (TOPROL-XL) 25 MG 24 hr tablet Take 12.5 mg by mouth as needed. Taking 1/2 tablet by mouth if heart rate is greater than 100 and top number blood pressure to be above 100    . Probiotic Product (PROBIOTIC DAILY PO) Take 1 capsule by mouth daily.     No current facility-administered medications for this encounter.    No Known Allergies  Social History   Socioeconomic History  . Marital status: Divorced    Spouse name: Not on file  . Number of children: Not on file  . Years of education: Not on file  . Highest education level: Not on file  Occupational History  . Occupation: Retired     Comment: Warden/ranger   Tobacco Use  . Smoking status: Never Smoker  . Smokeless tobacco: Never Used  Vaping Use  . Vaping Use: Never used  Substance and Sexual Activity  . Alcohol use: Yes    Alcohol/week: 1.0 standard drink    Types: 1 Cans of beer per week    Comment: once a month  . Drug use: No  . Sexual activity: Never    Birth control/protection: Post-menopausal  Other Topics Concern  . Not on file  Social History Narrative   Enjoys spending time with neighbors    Social Determinants of Health   Financial Resource Strain:   . Difficulty of Paying Living Expenses: Not on file  Food Insecurity:   . Worried About Programme researcher, broadcasting/film/video in the Last Year: Not on file  . Ran Out of Food in the Last Year: Not on file  Transportation Needs:   . Lack of Transportation (Medical): Not on file  . Lack of Transportation (Non-Medical): Not on file  Physical Activity:   . Days of Exercise per Week: Not on file  . Minutes of Exercise per Session: Not on file  Stress:   . Feeling of Stress : Not on file  Social Connections:   . Frequency of Communication with Friends and Family: Not on file  . Frequency of Social Gatherings with Friends and Family: Not on file   . Attends Religious Services: Not on file  . Active Member of Clubs or Organizations: Not on file  . Attends Banker Meetings: Not on file  . Marital Status: Not on file  Intimate Partner Violence:   . Fear of Current or Ex-Partner: Not on file  . Emotionally Abused: Not on file  . Physically Abused: Not on file  . Sexually Abused: Not on file     ROS- All systems are reviewed and negative except as per the HPI above.  Physical Exam: Vitals:   06/14/20 1130  BP: 134/76  Pulse: 80  Weight: 79.4 kg  Height: 5\' 6"  (1.676 m)    GEN- The patient is well appearing elderly female, alert and oriented x 3 today.   HEENT-head normocephalic, atraumatic, sclera clear, conjunctiva pink, hearing intact, trachea  midline. Lungs- Clear to ausculation bilaterally, normal work of breathing Heart- Regular rate and rhythm, no murmurs, rubs or gallops  GI- soft, NT, ND, + BS Extremities- no clubbing, cyanosis, or edema MS- no significant deformity or atrophy Skin- no rash or lesion Psych- euthymic mood, full affect Neuro- strength and sensation are intact   Wt Readings from Last 3 Encounters:  06/14/20 79.4 kg  04/27/20 81.3 kg  03/13/20 78.5 kg    EKG today demonstrates SR HR 80, 1st degree AV block, PR 204, QRS 76, QTc 431  TEE 03/08/19 1. No evidence of a thrombus present in the left atrial appendage.  2. The aortic valve is grossly normal Aortic valve regurgitation is mild by color flow Doppler.  3. The tricuspid valve was grossly normal.  4. The aortic root is normal in size and structure.  5. There is evidence of mild plaque in the descending aorta.  6. The left ventricle has normal systolic function, with an ejection fraction of 55-60%.  FINDINGS  Left Ventricle: The left ventricle has normal systolic function, with an ejection fraction of 55-60%.  TEE documentation note mentioned EF 35-40%   Echo 06/22/19 1. Left ventricular ejection fraction, by visual  estimation, is 60 to  65%. The left ventricle has normal function. There is no left ventricular  hypertrophy.  2. Left ventricular diastolic parameters are indeterminate.  3. Global right ventricle has normal systolic function.The right  ventricular size is normal. No increase in right ventricular wall  thickness.  4. Left atrial size was normal.  5. Right atrial size was normal.  6. The mitral valve is normal in structure. No evidence of mitral valve  regurgitation. No evidence of mitral stenosis.  7. The tricuspid valve is normal in structure. Tricuspid valve  regurgitation is trivial.  8. The aortic valve was not assessed. Aortic valve regurgitation is mild.  No evidence of aortic valve sclerosis or stenosis.  9. The pulmonic valve was normal in structure. Pulmonic valve  regurgitation is trivial.  10. Normal pulmonary artery systolic pressure.  11. The atrial septum is grossly normal.    Epic records are reviewed at length today  CHA2DS2-VASc Score = 3  The patient's score is based upon: CHF History: 0 HTN History: 0 Diabetes History: 0 Stroke History: 0 Vascular Disease History: 0 Age Score: 2 Gender Score: 1      ASSESSMENT AND PLAN: 1. Atrial flutter The patient's CHA2DS2-VASc score is 3, indicating a 3.2% annual risk of stroke.   Patient appears to be maintaining SR.  Continue diltiazem 120 mg daily Continue Eliquis 5 mg BID  2. Secondary Hypercoagulable State (ICD10:  D68.69) The patient is at significant risk for stroke/thromboembolism based upon her CHA2DS2-VASc Score of 3.  Continue Apixaban (Eliquis).   3. H/o Cardiomyopathy EF normalized, suspect tachycardia mediated.     Follow up with Dr Jacques Navy as scheduled. AF clinic in 6 months.    Jorja Loa PA-C Afib Clinic [facility] [address] [place], [place] [address] [phone] 06/14/2020 1:14 PM

## 2020-06-22 ENCOUNTER — Other Ambulatory Visit (HOSPITAL_COMMUNITY): Payer: Self-pay | Admitting: *Deleted

## 2020-06-22 MED ORDER — APIXABAN 5 MG PO TABS
ORAL_TABLET | ORAL | 2 refills | Status: DC
Start: 2020-06-22 — End: 2020-12-06

## 2020-07-03 ENCOUNTER — Ambulatory Visit: Payer: Medicare Other

## 2020-07-06 DIAGNOSIS — Z0120 Encounter for dental examination and cleaning without abnormal findings: Secondary | ICD-10-CM | POA: Diagnosis not present

## 2020-08-09 ENCOUNTER — Other Ambulatory Visit: Payer: Self-pay

## 2020-08-09 ENCOUNTER — Ambulatory Visit
Admission: RE | Admit: 2020-08-09 | Discharge: 2020-08-09 | Disposition: A | Discharge status: Home / Self Care | Payer: Medicare Other | Source: Ambulatory Visit | Attending: Family Medicine | Admitting: Family Medicine

## 2020-08-09 DIAGNOSIS — Z1231 Encounter for screening mammogram for malignant neoplasm of breast: Secondary | ICD-10-CM

## 2020-08-21 ENCOUNTER — Telehealth (HOSPITAL_COMMUNITY): Payer: Self-pay | Admitting: *Deleted

## 2020-08-21 MED ORDER — METOPROLOL TARTRATE 25 MG PO TABS: ORAL_TABLET | ORAL | 3 refills | Status: DC

## 2020-08-21 NOTE — Telephone Encounter (Signed)
Pt noticed her HR by pulse ox is elevated in the 120s since yesterday evening.  Last night she put ice packs on her chest this seemed to lower her HR and she went to bed.  Her HR this morning was back in the 120s she took 12.5mg  of lopressor at 730am.  She does not have a way of checking her BP.  Discussed with Jorja Loa PA  Take 25mg  of lopressor at lunchtime call with update this afternoon. Pt in agreement.

## 2020-08-21 NOTE — H&P (View-Only) (Signed)
Primary Care Physician: Willow Ora, MD Primary Cardiologist: Dr Jacques Navy Primary Electrophysiologist: none Referring Physician: Dr [name] is a 87 y.o. female with a history of hypothyroidism, migraines and atrial flutter who presents for follow up in the [facility].  The patient was initially diagnosed with atrial flutter after presenting to the ER on 03/04/19 with symptoms of heart racing in the 140s Bpm. No other associated symptoms, but in hindsight she does note that she was more fatigued. There were no triggers that the patient could identify. She underwent TEE/DCCV after being loaded on IV amiodarone. Patient's amiodarone was stopped 2/2 abnormal thyroid panel. On follow up 05/2019 she was maintaining SR off AAD. Her EF normalized on echo 06/22/19.  On follow up today, patient reports she felt her heart racing on 08/21/19 with symptoms of fatigue. Her heart rates were initially ~150 bpm and decreased to 120s with PRN BB. ECG today shows atypical flutter. There were no specific triggers that she could identify.   Today, she denies symptoms of chest pain, shortness of breath, orthopnea, PND, lower extremity edema, dizziness, presyncope, syncope, snoring, daytime somnolence, bleeding, or neurologic sequela. The patient is tolerating medications without difficulties and is otherwise without complaint today.    Atrial Fibrillation Risk Factors:  she does not have symptoms or diagnosis of sleep apnea. she does not have a history of rheumatic fever. she does not have a history of alcohol use. The patient does not have a history of early familial atrial fibrillation or other arrhythmias.  she has a BMI of Body mass index is 28.12 kg/m.[name] Kitchen Filed Weights   08/22/20 0939  Weight: 79 kg    Family History  Problem Relation Age of Onset  . Arthritis Mother   . Cancer Mother   . Cancer Sister   . Diabetes Brother   . Cancer Maternal Grandmother    . Stroke Maternal Grandfather   . Cancer Paternal Grandfather   . Hearing loss Sister   . Cancer Son      Atrial Fibrillation Management history:  Previous antiarrhythmic drugs: amiodarone  Previous cardioversions: 03/08/19 Previous ablations: none CHADS2VASC score: 3 Anticoagulation history: Eliquis   Past Medical History:  Diagnosis Date  . Atrial fibrillation (HCC)   . History of migraine - none since age 11 10/27/2017  . Osteoporosis 05/10/2019   dexa 04/2019, lowest T = -2.6 at hip. To discuss 10/20 and offer tx.  . Primary osteoarthritis of knee   . Thyroid disease    Past Surgical History:  Procedure Laterality Date  . BUNIONECTOMY  1991  . CARDIOVERSION N/A 03/08/2019   Procedure: CARDIOVERSION;  Surgeon: Elease Hashimoto Deloris Ping, MD;  Location: [facility] ENDOSCOPY;  Service: Cardiovascular;  Laterality: N/A;  . CATARACT EXTRACTION  2008-2009  . TEE WITHOUT CARDIOVERSION N/A 03/08/2019   Procedure: TRANSESOPHAGEAL ECHOCARDIOGRAM (TEE);  Surgeon: Elease Hashimoto Deloris Ping, MD;  Location: [facility] ENDOSCOPY;  Service: Cardiovascular;  Laterality: N/A;  . TONSILLECTOMY AND ADENOIDECTOMY  1953    Current Outpatient Medications  Medication Sig Dispense Refill  . acetaminophen (TYLENOL) 500 MG tablet Take 500 mg by mouth as needed.    [name] Kitchen apixaban (ELIQUIS) 5 MG TABS tablet TAKE 1 TABLET(5 MG) BY MOUTH TWICE DAILY 180 tablet 2  . B Complex Vitamins (VITAMIN B COMPLEX PO) Take 1 tablet by mouth daily with breakfast.     . calcium citrate-vitamin D (CITRACAL+D) 315-200 MG-UNIT tablet Take 1 tablet by mouth in the morning  and at bedtime.    [name] Kitchen diltiazem (CARDIZEM CD) 120 MG 24 hr capsule Take 1 capsule (120 mg total) by mouth daily. 30 capsule 6  . levothyroxine (SYNTHROID) 50 MCG tablet TAKE 1 TABLET BY MOUTH IN THE MORNING BEFORE BREAKFAST ON SUNDAY/MONDAY/WEDNESDAY/FRIDAY/SATURDAY AND 2 TABLETS ON TUESDAYS AND THURSDAYS 450 tablet 2  . LORazepam (ATIVAN) 2 MG tablet Take 0.5 tablets (1 mg total) by mouth  at bedtime as needed for sleep. 30 tablet 3  . Magnesium 400 MG TABS Take 1 tablet by mouth daily. Magnesium Glycinate    . metoprolol tartrate (LOPRESSOR) 25 MG tablet Take 1 tablet every 6 hours as needed for elevated HRs 180 tablet 3   No current facility-administered medications for this encounter.    No Known Allergies  Social History   Socioeconomic History  . Marital status: Divorced    Spouse name: Not on file  . Number of children: Not on file  . Years of education: Not on file  . Highest education level: Not on file  Occupational History  . Occupation: Retired     Comment: Engineer, water   Tobacco Use  . Smoking status: Never Smoker  . Smokeless tobacco: Never Used  Vaping Use  . Vaping Use: Never used  Substance and Sexual Activity  . Alcohol use: Yes    Alcohol/week: 1.0 standard drink    Types: 1 Cans of beer per week    Comment: once a month  . Drug use: No  . Sexual activity: Never    Birth control/protection: Post-menopausal  Other Topics Concern  . Not on file  Social History Narrative   Enjoys spending time with neighbors    Social Determinants of Health   Financial Resource Strain: Not on file  Food Insecurity: Not on file  Transportation Needs: Not on file  Physical Activity: Not on file  Stress: Not on file  Social Connections: Not on file  Intimate Partner Violence: Not on file     ROS- All systems are reviewed and negative except as per the HPI above.  Physical Exam: Vitals:   08/22/20 0939  BP: 110/80  Pulse: (!) 139  Weight: 79 kg  Height: 5\' 6"  (1.676 m)    GEN- The patient is well appearing elderly female, alert and oriented x 3 today.   HEENT-head normocephalic, atraumatic, sclera clear, conjunctiva pink, hearing intact, trachea midline. Lungs- Clear to ausculation bilaterally, normal work of breathing Heart- irregular rate and rhythm, tachycardia, no murmurs, rubs or gallops  GI- soft, NT, ND, + BS Extremities- no clubbing,  cyanosis, or edema MS- no significant deformity or atrophy Skin- no rash or lesion Psych- euthymic mood, full affect Neuro- strength and sensation are intact   Wt Readings from Last 3 Encounters:  08/22/20 79 kg  06/14/20 79.4 kg  04/27/20 81.3 kg    EKG today demonstrates  Atypical atrial flutter with variable block  Vent. rate 139 BPM QRS duration 72 ms QT/QTc 288/438 ms  TEE 03/08/19 1. No evidence of a thrombus present in the left atrial appendage.  2. The aortic valve is grossly normal Aortic valve regurgitation is mild by color flow Doppler.  3. The tricuspid valve was grossly normal.  4. The aortic root is normal in size and structure.  5. There is evidence of mild plaque in the descending aorta.  6. The left ventricle has normal systolic function, with an ejection fraction of 55-60%.  FINDINGS  Left Ventricle: The left ventricle has normal systolic function,  with an ejection fraction of 55-60%.  TEE documentation note mentioned EF 35-40%   Echo 06/22/19 1. Left ventricular ejection fraction, by visual estimation, is 60 to  65%. The left ventricle has normal function. There is no left ventricular  hypertrophy.  2. Left ventricular diastolic parameters are indeterminate.  3. Global right ventricle has normal systolic function.The right  ventricular size is normal. No increase in right ventricular wall  thickness.  4. Left atrial size was normal.  5. Right atrial size was normal.  6. The mitral valve is normal in structure. No evidence of mitral valve  regurgitation. No evidence of mitral stenosis.  7. The tricuspid valve is normal in structure. Tricuspid valve  regurgitation is trivial.  8. The aortic valve was not assessed. Aortic valve regurgitation is mild.  No evidence of aortic valve sclerosis or stenosis.  9. The pulmonic valve was normal in structure. Pulmonic valve  regurgitation is trivial.  10. Normal pulmonary artery systolic pressure.  11.  The atrial septum is grossly normal.    Epic records are reviewed at length today  CHA2DS2-VASc Score = 3  The patient's score is based upon: CHF History: No HTN History: No Diabetes History: No Stroke History: No Vascular Disease History: No Age Score: 2 Gender Score: 1      ASSESSMENT AND PLAN: 1. Atrial flutter The patient's CHA2DS2-VASc score is 3, indicating a 3.2% annual risk of stroke.   Patient in rapid atrial flutter. We discussed therapeutic options today. Will arrange for DCCV. Will increased diltiazem to 120 mg BID until DCCV then decreased back to daily. Continue Eliquis 5 mg BID. Patient denies any missed doses in the last 3 weeks. ED precautions given. May need to consider AAD (Tik, Multaq) if she has ERAF.  2. Secondary Hypercoagulable State (ICD10:  D68.69) The patient is at significant risk for stroke/thromboembolism based upon her CHA2DS2-VASc Score of 3.  Continue Apixaban (Eliquis).   3. H/o Cardiomyopathy EF normalized, suspect tachycardia mediated.  No signs or symptoms of fluid overload    Follow up with Dr Jacques Navy as scheduled post DCCV. Follow up in AF clinic per recall.    Jorja Loa PA-C [facility] [address] [place], [place] [address] [phone] 08/22/2020 9:47 AM

## 2020-08-21 NOTE — Progress Notes (Signed)
Primary Care Physician: Willow Ora, MD Primary Cardiologist: Dr Jacques Navy Primary Electrophysiologist: none Referring Physician: Dr [name] is a 87 y.o. female with a history of hypothyroidism, migraines and atrial flutter who presents for follow up in the [facility].  The patient was initially diagnosed with atrial flutter after presenting to the ER on 03/04/19 with symptoms of heart racing in the 140s Bpm. No other associated symptoms, but in hindsight she does note that she was more fatigued. There were no triggers that the patient could identify. She underwent TEE/DCCV after being loaded on IV amiodarone. Patient's amiodarone was stopped 2/2 abnormal thyroid panel. On follow up 05/2019 she was maintaining SR off AAD. Her EF normalized on echo 06/22/19.  On follow up today, patient reports she felt her heart racing on 08/21/19 with symptoms of fatigue. Her heart rates were initially ~150 bpm and decreased to 120s with PRN BB. ECG today shows atypical flutter. There were no specific triggers that she could identify.   Today, she denies symptoms of chest pain, shortness of breath, orthopnea, PND, lower extremity edema, dizziness, presyncope, syncope, snoring, daytime somnolence, bleeding, or neurologic sequela. The patient is tolerating medications without difficulties and is otherwise without complaint today.    Atrial Fibrillation Risk Factors:  she does not have symptoms or diagnosis of sleep apnea. she does not have a history of rheumatic fever. she does not have a history of alcohol use. The patient does not have a history of early familial atrial fibrillation or other arrhythmias.  she has a BMI of Body mass index is 28.12 kg/m.[name] Kitchen Filed Weights   08/22/20 0939  Weight: 79 kg    Family History  Problem Relation Age of Onset  . Arthritis Mother   . Cancer Mother   . Cancer Sister   . Diabetes Brother   . Cancer Maternal Grandmother    . Stroke Maternal Grandfather   . Cancer Paternal Grandfather   . Hearing loss Sister   . Cancer Son      Atrial Fibrillation Management history:  Previous antiarrhythmic drugs: amiodarone  Previous cardioversions: 03/08/19 Previous ablations: none CHADS2VASC score: 3 Anticoagulation history: Eliquis   Past Medical History:  Diagnosis Date  . Atrial fibrillation (HCC)   . History of migraine - none since age 11 10/27/2017  . Osteoporosis 05/10/2019   dexa 04/2019, lowest T = -2.6 at hip. To discuss 10/20 and offer tx.  . Primary osteoarthritis of knee   . Thyroid disease    Past Surgical History:  Procedure Laterality Date  . BUNIONECTOMY  1991  . CARDIOVERSION N/A 03/08/2019   Procedure: CARDIOVERSION;  Surgeon: Elease Hashimoto Deloris Ping, MD;  Location: [facility] ENDOSCOPY;  Service: Cardiovascular;  Laterality: N/A;  . CATARACT EXTRACTION  2008-2009  . TEE WITHOUT CARDIOVERSION N/A 03/08/2019   Procedure: TRANSESOPHAGEAL ECHOCARDIOGRAM (TEE);  Surgeon: Elease Hashimoto Deloris Ping, MD;  Location: [facility] ENDOSCOPY;  Service: Cardiovascular;  Laterality: N/A;  . TONSILLECTOMY AND ADENOIDECTOMY  1953    Current Outpatient Medications  Medication Sig Dispense Refill  . acetaminophen (TYLENOL) 500 MG tablet Take 500 mg by mouth as needed.    [name] Kitchen apixaban (ELIQUIS) 5 MG TABS tablet TAKE 1 TABLET(5 MG) BY MOUTH TWICE DAILY 180 tablet 2  . B Complex Vitamins (VITAMIN B COMPLEX PO) Take 1 tablet by mouth daily with breakfast.     . calcium citrate-vitamin D (CITRACAL+D) 315-200 MG-UNIT tablet Take 1 tablet by mouth in the morning  and at bedtime.    . diltiazem (CARDIZEM CD) 120 MG 24 hr capsule Take 1 capsule (120 mg total) by mouth daily. 30 capsule 6  . levothyroxine (SYNTHROID) 50 MCG tablet TAKE 1 TABLET BY MOUTH IN THE MORNING BEFORE BREAKFAST ON SUNDAY/MONDAY/WEDNESDAY/FRIDAY/SATURDAY AND 2 TABLETS ON TUESDAYS AND THURSDAYS 450 tablet 2  . LORazepam (ATIVAN) 2 MG tablet Take 0.5 tablets (1 mg total) by mouth  at bedtime as needed for sleep. 30 tablet 3  . Magnesium 400 MG TABS Take 1 tablet by mouth daily. Magnesium Glycinate    . metoprolol tartrate (LOPRESSOR) 25 MG tablet Take 1 tablet every 6 hours as needed for elevated HRs 180 tablet 3   No current facility-administered medications for this encounter.    No Known Allergies  Social History   Socioeconomic History  . Marital status: Divorced    Spouse name: Not on file  . Number of children: Not on file  . Years of education: Not on file  . Highest education level: Not on file  Occupational History  . Occupation: Retired     Comment: Psychologist   Tobacco Use  . Smoking status: Never Smoker  . Smokeless tobacco: Never Used  Vaping Use  . Vaping Use: Never used  Substance and Sexual Activity  . Alcohol use: Yes    Alcohol/week: 1.0 standard drink    Types: 1 Cans of beer per week    Comment: once a month  . Drug use: No  . Sexual activity: Never    Birth control/protection: Post-menopausal  Other Topics Concern  . Not on file  Social History Narrative   Enjoys spending time with neighbors    Social Determinants of Health   Financial Resource Strain: Not on file  Food Insecurity: Not on file  Transportation Needs: Not on file  Physical Activity: Not on file  Stress: Not on file  Social Connections: Not on file  Intimate Partner Violence: Not on file     ROS- All systems are reviewed and negative except as per the HPI above.  Physical Exam: Vitals:   08/22/20 0939  BP: 110/80  Pulse: (!) 139  Weight: 79 kg  Height: 5' 6" (1.676 m)    GEN- The patient is well appearing elderly female, alert and oriented x 3 today.   HEENT-head normocephalic, atraumatic, sclera clear, conjunctiva pink, hearing intact, trachea midline. Lungs- Clear to ausculation bilaterally, normal work of breathing Heart- irregular rate and rhythm, tachycardia, no murmurs, rubs or gallops  GI- soft, NT, ND, + BS Extremities- no clubbing,  cyanosis, or edema MS- no significant deformity or atrophy Skin- no rash or lesion Psych- euthymic mood, full affect Neuro- strength and sensation are intact   Wt Readings from Last 3 Encounters:  08/22/20 79 kg  06/14/20 79.4 kg  04/27/20 81.3 kg    EKG today demonstrates  Atypical atrial flutter with variable block  Vent. rate 139 BPM QRS duration 72 ms QT/QTc 288/438 ms  TEE 03/08/19 1. No evidence of a thrombus present in the left atrial appendage.  2. The aortic valve is grossly normal Aortic valve regurgitation is mild by color flow Doppler.  3. The tricuspid valve was grossly normal.  4. The aortic root is normal in size and structure.  5. There is evidence of mild plaque in the descending aorta.  6. The left ventricle has normal systolic function, with an ejection fraction of 55-60%.  FINDINGS  Left Ventricle: The left ventricle has normal systolic function,   with an ejection fraction of 55-60%.  TEE documentation note mentioned EF 35-40%   Echo 06/22/19 1. Left ventricular ejection fraction, by visual estimation, is 60 to  65%. The left ventricle has normal function. There is no left ventricular  hypertrophy.  2. Left ventricular diastolic parameters are indeterminate.  3. Global right ventricle has normal systolic function.The right  ventricular size is normal. No increase in right ventricular wall  thickness.  4. Left atrial size was normal.  5. Right atrial size was normal.  6. The mitral valve is normal in structure. No evidence of mitral valve  regurgitation. No evidence of mitral stenosis.  7. The tricuspid valve is normal in structure. Tricuspid valve  regurgitation is trivial.  8. The aortic valve was not assessed. Aortic valve regurgitation is mild.  No evidence of aortic valve sclerosis or stenosis.  9. The pulmonic valve was normal in structure. Pulmonic valve  regurgitation is trivial.  10. Normal pulmonary artery systolic pressure.  11.  The atrial septum is grossly normal.    Epic records are reviewed at length today  CHA2DS2-VASc Score = 3  The patient's score is based upon: CHF History: No HTN History: No Diabetes History: No Stroke History: No Vascular Disease History: No Age Score: 2 Gender Score: 1      ASSESSMENT AND PLAN: 1. Atrial flutter The patient's CHA2DS2-VASc score is 3, indicating a 3.2% annual risk of stroke.   Patient in rapid atrial flutter. We discussed therapeutic options today. Will arrange for DCCV. Will increased diltiazem to 120 mg BID until DCCV then decreased back to daily. Continue Eliquis 5 mg BID. Patient denies any missed doses in the last 3 weeks. ED precautions given. May need to consider AAD (Tik, Multaq) if she has ERAF.  2. Secondary Hypercoagulable State (ICD10:  D68.69) The patient is at significant risk for stroke/thromboembolism based upon her CHA2DS2-VASc Score of 3.  Continue Apixaban (Eliquis).   3. H/o Cardiomyopathy EF normalized, suspect tachycardia mediated.  No signs or symptoms of fluid overload    Follow up with Dr Jacques Navy as scheduled post DCCV. Follow up in AF clinic per recall.    Jorja Loa PA-C [facility] [address] [place], [place] [address] [phone] 08/22/2020 9:47 AM

## 2020-08-22 ENCOUNTER — Encounter (HOSPITAL_COMMUNITY): Payer: Self-pay | Admitting: Physician Assistant

## 2020-08-22 ENCOUNTER — Other Ambulatory Visit: Payer: Self-pay

## 2020-08-22 ENCOUNTER — Other Ambulatory Visit (HOSPITAL_COMMUNITY): Payer: Self-pay | Admitting: *Deleted

## 2020-08-22 ENCOUNTER — Ambulatory Visit (HOSPITAL_COMMUNITY)
Admission: RE | Admit: 2020-08-22 | Discharge: 2020-08-22 | Disposition: A | Discharge status: Home / Self Care | Payer: Medicare Other | Source: Ambulatory Visit | Attending: Physician Assistant | Admitting: Physician Assistant

## 2020-08-22 VITALS — BP 110/80 | HR 139 | Ht 66.0 in | Wt 174.2 lb

## 2020-08-22 DIAGNOSIS — I351 Nonrheumatic aortic (valve) insufficiency: Secondary | ICD-10-CM | POA: Insufficient documentation

## 2020-08-22 DIAGNOSIS — I4892 Unspecified atrial flutter: Secondary | ICD-10-CM

## 2020-08-22 DIAGNOSIS — I429 Cardiomyopathy, unspecified: Secondary | ICD-10-CM | POA: Diagnosis not present

## 2020-08-22 DIAGNOSIS — I4430 Unspecified atrioventricular block: Secondary | ICD-10-CM | POA: Insufficient documentation

## 2020-08-22 DIAGNOSIS — D6869 Other thrombophilia: Secondary | ICD-10-CM | POA: Diagnosis not present

## 2020-08-22 DIAGNOSIS — I484 Atypical atrial flutter: Secondary | ICD-10-CM

## 2020-08-22 LAB — CBC
HCT: 47.6 % — ABNORMAL HIGH (ref 36.0–46.0)
Hemoglobin: 15.6 g/dL — ABNORMAL HIGH (ref 12.0–15.0)
MCH: 29.9 pg (ref 26.0–34.0)
MCHC: 32.8 g/dL (ref 30.0–36.0)
MCV: 91.4 fL (ref 80.0–100.0)
Platelets: 298 10*3/uL (ref 150–400)
RBC: 5.21 MIL/uL — ABNORMAL HIGH (ref 3.87–5.11)
RDW: 15.1 % (ref 11.5–15.5)
WBC: 8.4 10*3/uL (ref 4.0–10.5)
nRBC: 0 % (ref 0.0–0.2)

## 2020-08-22 LAB — BASIC METABOLIC PANEL
Anion gap: 13 (ref 5–15)
BUN: 16 mg/dL (ref 8–23)
CO2: 20 mmol/L — ABNORMAL LOW (ref 22–32)
Calcium: 9.3 mg/dL (ref 8.9–10.3)
Chloride: 106 mmol/L (ref 98–111)
Creatinine, Ser: 1.16 mg/dL — ABNORMAL HIGH (ref 0.44–1.00)
GFR, Estimated: 46 mL/min — ABNORMAL LOW (ref >60)
Glucose, Bld: 104 mg/dL — ABNORMAL HIGH (ref 70–99)
Potassium: 4.5 mmol/L (ref 3.5–5.1)
Sodium: 139 mmol/L (ref 135–145)

## 2020-08-22 MED ORDER — DILTIAZEM HCL ER COATED BEADS 120 MG PO CP24: ORAL_CAPSULE | ORAL | 2 refills | Status: AC

## 2020-08-22 NOTE — Patient Instructions (Signed)
Cardioversion scheduled for Monday, January 10th  - Arrive at the [facility] and go to admitting at  - Do not eat or drink anything after midnight the night prior to your procedure.  - Take all your morning medication (except diabetic medications) with a sip of water prior to arrival.  - You will not be able to drive home after your procedure.  - Do NOT miss any doses of your blood thinner - if you should miss a dose please notify our office immediately.  - If you feel as if you go back into normal rhythm prior to scheduled cardioversion, please notify our office immediately. If your procedure is canceled in the cardioversion suite you will be charged a cancellation fee.   Increase cardizem to 120mg  twice a day until day of cardioversion then reduce back to once a day

## 2020-08-26 ENCOUNTER — Other Ambulatory Visit (HOSPITAL_COMMUNITY)
Admission: RE | Admit: 2020-08-26 | Discharge: 2020-08-26 | Disposition: A | Discharge status: Home / Self Care | Payer: Medicare Other | Source: Ambulatory Visit | Attending: Cardiology | Admitting: Cardiology

## 2020-08-26 DIAGNOSIS — Z20822 Contact with and (suspected) exposure to covid-19: Secondary | ICD-10-CM | POA: Insufficient documentation

## 2020-08-26 DIAGNOSIS — Z01812 Encounter for preprocedural laboratory examination: Secondary | ICD-10-CM | POA: Diagnosis not present

## 2020-08-27 LAB — SARS CORONAVIRUS 2 (TAT 6-24 HRS): SARS Coronavirus 2: NEGATIVE

## 2020-08-28 ENCOUNTER — Other Ambulatory Visit: Payer: Self-pay

## 2020-08-28 ENCOUNTER — Ambulatory Visit (HOSPITAL_COMMUNITY): Payer: Medicare Other | Admitting: Certified Registered"

## 2020-08-28 ENCOUNTER — Encounter (HOSPITAL_COMMUNITY)
Admission: RE | Disposition: A | Discharge status: Home / Self Care | Payer: Self-pay | Source: Home / Self Care | Attending: Cardiology

## 2020-08-28 ENCOUNTER — Encounter (HOSPITAL_COMMUNITY): Payer: Self-pay | Admitting: Cardiology

## 2020-08-28 ENCOUNTER — Ambulatory Visit (HOSPITAL_COMMUNITY)
Admission: RE | Admit: 2020-08-28 | Discharge: 2020-08-28 | Disposition: A | Discharge status: Home / Self Care | Payer: Medicare Other | Attending: Cardiology | Admitting: Cardiology

## 2020-08-28 DIAGNOSIS — Z7901 Long term (current) use of anticoagulants: Secondary | ICD-10-CM | POA: Diagnosis not present

## 2020-08-28 DIAGNOSIS — E039 Hypothyroidism, unspecified: Secondary | ICD-10-CM | POA: Diagnosis not present

## 2020-08-28 DIAGNOSIS — I484 Atypical atrial flutter: Secondary | ICD-10-CM | POA: Diagnosis not present

## 2020-08-28 DIAGNOSIS — I4891 Unspecified atrial fibrillation: Secondary | ICD-10-CM | POA: Diagnosis not present

## 2020-08-28 DIAGNOSIS — I4892 Unspecified atrial flutter: Secondary | ICD-10-CM | POA: Insufficient documentation

## 2020-08-28 DIAGNOSIS — Z79890 Hormone replacement therapy (postmenopausal): Secondary | ICD-10-CM | POA: Diagnosis not present

## 2020-08-28 DIAGNOSIS — D6869 Other thrombophilia: Secondary | ICD-10-CM | POA: Diagnosis not present

## 2020-08-28 DIAGNOSIS — I10 Essential (primary) hypertension: Secondary | ICD-10-CM | POA: Diagnosis not present

## 2020-08-28 DIAGNOSIS — Z79899 Other long term (current) drug therapy: Secondary | ICD-10-CM | POA: Insufficient documentation

## 2020-08-28 HISTORY — PX: CARDIOVERSION: SHX1299

## 2020-08-28 SURGERY — CARDIOVERSION
Anesthesia: General

## 2020-08-28 MED ORDER — PROPOFOL 10 MG/ML IV BOLUS
INTRAVENOUS | Status: DC | PRN
  Administered 2020-08-28: 50 mg via INTRAVENOUS

## 2020-08-28 MED ORDER — LIDOCAINE HCL (CARDIAC) PF 100 MG/5ML IV SOSY
PREFILLED_SYRINGE | INTRAVENOUS | Status: DC | PRN
  Administered 2020-08-28: 50 mg via INTRAVENOUS

## 2020-08-28 MED ORDER — LACTATED RINGERS IV SOLN: INTRAVENOUS | Status: DC | PRN

## 2020-08-28 NOTE — Procedures (Signed)
Procedure: Electrical Cardioversion Indications:  Atrial Flutter  Procedure Details:  Consent: Risks of procedure as well as the alternatives and risks of each were explained to the (patient/caregiver).  Consent for procedure obtained.  Time Out: Verified patient identification, verified procedure, site/side was marked, verified correct patient position, special equipment/implants available, medications/allergies/relevent history reviewed, required imaging and test results available. PERFORMED.  Patient placed on cardiac monitor, pulse oximetry, supplemental oxygen as necessary.  Sedation given: Propofol 50mg ; lidocaine 50mg  administered by CV anesthesia Pacer pads placed anterior and posterior chest.  Cardioverted 1 time(s).  Cardioversion with synchronized biphasic 150J shock.  Evaluation: Findings: Post procedure EKG shows: NSR Complications: None Patient did tolerate procedure well.  Time Spent Directly with the Patient:    08/28/2020, 9:24 AM

## 2020-08-28 NOTE — Anesthesia Preprocedure Evaluation (Signed)
Anesthesia Evaluation  Patient identified by MRN, date of birth, ID band Patient awake    Reviewed: Allergy & Precautions, H&P , NPO status , Patient's Chart, lab work & pertinent test results  Airway Mallampati: II   Neck ROM: full    Dental   Pulmonary neg pulmonary ROS,    breath sounds clear to auscultation       Cardiovascular hypertension, + dysrhythmias Atrial Fibrillation  Rhythm:irregular Rate:Normal     Neuro/Psych    GI/Hepatic   Endo/Other  Hypothyroidism   Renal/GU      Musculoskeletal  (+) Arthritis ,   Abdominal   Peds  Hematology   Anesthesia Other Findings   Reproductive/Obstetrics                             Anesthesia Physical Anesthesia Plan  ASA: III  Anesthesia Plan: General   Post-op Pain Management:    Induction: Intravenous  PONV Risk Score and Plan: 3 and Propofol infusion and Treatment may vary due to age or medical condition  Airway Management Planned: Mask  Additional Equipment:   Intra-op Plan:   Post-operative Plan:   Informed Consent: I have reviewed the patients History and Physical, chart, labs and discussed the procedure including the risks, benefits and alternatives for the proposed anesthesia with the patient or authorized representative who has indicated his/her understanding and acceptance.     Dental advisory given  Plan Discussed with: CRNA, Anesthesiologist and Surgeon  Anesthesia Plan Comments:         Anesthesia Quick Evaluation

## 2020-08-28 NOTE — Transfer of Care (Signed)
Immediate Anesthesia Transfer of Care Note  Patient: Cathy Lambert  Procedure(s) Performed: CARDIOVERSION (N/A )  Patient Location: PACU  Anesthesia Type:MAC  Level of Consciousness: awake, alert  and oriented  Airway & Oxygen Therapy: Patient Spontanous Breathing  Post-op Assessment: Report given to RN and Post -op Vital signs reviewed and stable  Post vital signs: Reviewed and stable  Last Vitals:  Vitals Value Taken Time  BP    Temp    Pulse    Resp    SpO2      Last Pain:  Vitals:   08/28/20 0830  TempSrc: Oral  PainSc: 0-No pain         Complications: No complications documented.

## 2020-08-28 NOTE — Interval H&P Note (Signed)
History and Physical Interval Note:  08/28/2020 8:48 AM  Cathy Lambert  has presented today for surgery, with the diagnosis of AFIB.  The various methods of treatment have been discussed with the patient and family. After consideration of risks, benefits and other options for treatment, the patient has consented to  Procedure(s): CARDIOVERSION (N/A) as a surgical intervention.  The patient's history has been reviewed, patient examined, no change in status, stable for surgery.  I have reviewed the patient's chart and labs.  Questions were answered to the patient's satisfaction.     Meriam Sprague

## 2020-08-29 NOTE — Anesthesia Postprocedure Evaluation (Signed)
Anesthesia Post Note  Patient: Cathy Lambert  Procedure(s) Performed: CARDIOVERSION (N/A )     Patient location during evaluation: Endoscopy Anesthesia Type: General Level of consciousness: awake and alert Pain management: pain level controlled Vital Signs Assessment: post-procedure vital signs reviewed and stable Respiratory status: spontaneous breathing, nonlabored ventilation, respiratory function stable and patient connected to nasal cannula oxygen Cardiovascular status: blood pressure returned to baseline and stable Postop Assessment: no apparent nausea or vomiting Anesthetic complications: no   No complications documented.  Last Vitals:  Vitals:   08/28/20 0938 08/28/20 0948  BP: (!) 95/59 98/61  Pulse: 71 77  Resp: 15 17  Temp:    SpO2: 96% 97%    Last Pain:  Vitals:   08/28/20 0948  TempSrc:   PainSc: 0-No pain                 Diedre Maclellan S

## 2020-08-30 ENCOUNTER — Telehealth (HOSPITAL_COMMUNITY): Payer: Self-pay | Admitting: *Deleted

## 2020-08-30 NOTE — Telephone Encounter (Signed)
Patient to have 1 tooth extraction by Dr. Shanna Cisco on 1/18. Patient CANNOT hold Eliquis as she just had cardioversion on 1/10 which requires 4 weeks of uninterrupted therapy. Per Jorja Loa PA ok to proceed with extraction as long as continuation of Eliquis without interruption.

## 2020-09-12 ENCOUNTER — Encounter: Payer: Self-pay | Admitting: Internal Medicine

## 2020-09-12 ENCOUNTER — Other Ambulatory Visit: Payer: Self-pay

## 2020-09-12 ENCOUNTER — Ambulatory Visit: Payer: Medicare Other | Admitting: Internal Medicine

## 2020-09-12 VITALS — BP 134/78 | HR 82 | Ht 66.0 in | Wt 169.4 lb

## 2020-09-12 DIAGNOSIS — D6869 Other thrombophilia: Secondary | ICD-10-CM | POA: Diagnosis not present

## 2020-09-12 DIAGNOSIS — E785 Hyperlipidemia, unspecified: Secondary | ICD-10-CM

## 2020-09-12 DIAGNOSIS — Z7901 Long term (current) use of anticoagulants: Secondary | ICD-10-CM

## 2020-09-12 DIAGNOSIS — E039 Hypothyroidism, unspecified: Secondary | ICD-10-CM

## 2020-09-12 DIAGNOSIS — I484 Atypical atrial flutter: Secondary | ICD-10-CM | POA: Diagnosis not present

## 2020-09-12 NOTE — Patient Instructions (Signed)

## 2020-09-12 NOTE — Progress Notes (Signed)
Cardiology Office Note:    Date:  09/12/2020   ID:  Cathy, Lambert [birth year], MRN [id number]  PCP:  Willow Ora, MD  Cardiologist:  Parke Poisson, MD  Electrophysiologist:  None   Referring MD: Willow Ora, MD   Chief Complaint/Reason for Referral: Atrial flutter  History of Present Illness:    Cathy Lambert is a 87 y.o. female with a history of migraines, hypothyroidism, and atrial flutter who presents for follow up.   Recently seen by afib clinic for rapid atrial flutter with subsequent successful cardioversion with 1 shock of 150 J.   We reviewed triggers for atrial fibrillation including dehydration, alcohol consumption, and medical stressors. She feels well today but is concerned about chance of recurrent PAF.  The patient denies chest pain, chest pressure, dyspnea at rest or with exertion, palpitations, PND, orthopnea, or leg swelling. Denies cough, fever, chills. Denies nausea, vomiting. Denies syncope or presyncope. Denies dizziness or lightheadedness.   Past Medical History:  Diagnosis Date  . Atrial fibrillation (HCC)   . History of migraine - none since age 69 10/27/2017  . Osteoporosis 05/10/2019   dexa 04/2019, lowest T = -2.6 at hip. To discuss 10/20 and offer tx.  . Primary osteoarthritis of knee   . Thyroid disease     Past Surgical History:  Procedure Laterality Date  . BUNIONECTOMY  1991  . CARDIOVERSION N/A 03/08/2019   Procedure: CARDIOVERSION;  Surgeon: Elease Hashimoto Deloris Ping, MD;  Location: [facility] ENDOSCOPY;  Service: Cardiovascular;  Laterality: N/A;  . CARDIOVERSION N/A 08/28/2020   Procedure: CARDIOVERSION;  Surgeon: Meriam Sprague, MD;  Location: [facility] ENDOSCOPY;  Service: Cardiovascular;  Laterality: N/A;  . CATARACT EXTRACTION  2008-2009  . TEE WITHOUT CARDIOVERSION N/A 03/08/2019   Procedure: TRANSESOPHAGEAL ECHOCARDIOGRAM (TEE);  Surgeon: Vesta Mixer, MD;  Location: [facility] ENDOSCOPY;  Service: Cardiovascular;  Laterality: N/A;  .  TONSILLECTOMY AND ADENOIDECTOMY  1953    Current Medications: Current Meds  Medication Sig  . acetaminophen (TYLENOL) 500 MG tablet Take 500 mg by mouth daily as needed for headache or mild pain.  [name] Kitchen apixaban (ELIQUIS) 5 MG TABS tablet TAKE 1 TABLET(5 MG) BY MOUTH TWICE DAILY (Patient taking differently: Take 5 mg by mouth 2 (two) times daily.)  . B Complex Vitamins (VITAMIN B COMPLEX PO) Take 50 mg by mouth daily with breakfast.  . calcium citrate-vitamin D (CITRACAL+D) 315-200 MG-UNIT tablet Take 1 tablet by mouth in the morning and at bedtime.  [name] Kitchen diltiazem (CARDIZEM CD) 120 MG 24 hr capsule Take 1 tablet by mouth once a day. Can take extra tablet daily for breakthrough afib. (Patient taking differently: Take 240 mg by mouth daily. Can take extra tablet daily for breakthrough afib.)  . levothyroxine (SYNTHROID) 50 MCG tablet TAKE 1 TABLET BY MOUTH IN THE MORNING BEFORE BREAKFAST ON SUNDAY/MONDAY/WEDNESDAY/FRIDAY/SATURDAY AND 2 TABLETS ON TUESDAYS AND THURSDAYS (Patient taking differently: Take 50 mcg by mouth See admin instructions. Take 50 mg TABLET BY MOUTH IN THE MORNING BEFORE BREAKFAST ON SUNDAY/MONDAY/WEDNESDAY/FRIDAY/SATURDAY AND 100 mg TABLETS ON TUESDAYS AND THURSDAYS)  . LORazepam (ATIVAN) 2 MG tablet Take 0.5 tablets (1 mg total) by mouth at bedtime as needed for sleep.  . Magnesium 400 MG TABS Take 800 mg by mouth at bedtime. Magnesium Glycinate     Allergies:   Patient has no known allergies.   Social History   Tobacco Use  . Smoking status: Never Smoker  . Smokeless tobacco: Never Used  Vaping Use  .  Vaping Use: Never used  Substance Use Topics  . Alcohol use: Yes    Alcohol/week: 1.0 standard drink    Types: 1 Cans of beer per week    Comment: once a month  . Drug use: No     Family History: The patient's family history includes Arthritis in her mother; Cancer in her maternal grandmother, mother, paternal grandfather, sister, and son; Diabetes in her brother; Hearing  loss in her sister; Stroke in her maternal grandfather.  ROS:   Please see the history of present illness.    All other systems reviewed and are negative.  EKGs/Labs/Other Studies Reviewed:    The following studies were reviewed today:  EKG: NSR, rate 82 bpm  Recent Labs: 04/27/2020: ALT 26; TSH 1.98 08/22/2020: BUN 16; Creatinine, Ser 1.16; Hemoglobin 15.6; Platelets 298; Potassium 4.5; Sodium 139  Recent Lipid Panel    Component Value Date/Time   CHOL 199 10/25/2019 1002   CHOL 220 (H) 03/26/2019 0906   TRIG 78.0 10/25/2019 1002   HDL 66.90 10/25/2019 1002   HDL 76 03/26/2019 0906   CHOLHDL 3 10/25/2019 1002   VLDL 15.6 10/25/2019 1002   LDLCALC 116 (H) 10/25/2019 1002   LDLCALC 127 (H) 03/26/2019 0906    Physical Exam:    VS:  BP 134/78 (BP Location: Left Arm, Patient Position: Sitting)   Pulse 82   Ht 5\' 6"  (1.676 m)   Wt 169 lb 6.4 oz (76.8 kg)   SpO2 96%   BMI 27.34 kg/m     Wt Readings from Last 5 Encounters:  09/12/20 169 lb 6.4 oz (76.8 kg)  08/28/20 172 lb (78 kg)  08/22/20 174 lb 3.2 oz (79 kg)  06/14/20 175 lb (79.4 kg)  04/27/20 179 lb 3.2 oz (81.3 kg)    Constitutional: No acute distress Eyes: sclera non-icteric, normal conjunctiva and lids ENMT: normal dentition, moist mucous membranes Cardiovascular: regular rhythm, normal rate, no murmurs. S1 and S2 normal. Radial pulses normal bilaterally. No jugular venous distention.  Respiratory: clear to auscultation bilaterally GI : normal bowel sounds, soft and nontender. No distention.   MSK: extremities warm, well perfused. No edema.  NEURO: grossly nonfocal exam, moves all extremities. PSYCH: alert and oriented x 3, normal mood and affect.   ASSESSMENT:    1. Atypical atrial flutter (HCC)   2. Secondary hypercoagulable state (HCC)   3. Long term (current) use of anticoagulants   4. Hyperlipidemia, unspecified hyperlipidemia type   5. Acquired hypothyroidism    PLAN:    Atypical atrial flutter  (HCC) Secondary hypercoagulable state (HCC) Long term (current) use of anticoagulants - continue Eliquis and diltiazem.  - can use metoprolol PRN for palpitations. We reviewed dosing and timing in great detail.   Hyperlipidemia, unspecified hyperlipidemia type - Plan: EKG 12-Lead - continue diet and lifestyle modification.   Acquired hypothyroidism - levothyroxine, per PCP.  Total time of encounter: 35 minutes total time of encounter, including 30 minutes spent in face-to-face patient care on the date of this encounter. This time includes coordination of care and counseling regarding above mentioned problem list. Remainder of non-face-to-face time involved reviewing chart documents/testing relevant to the patient encounter and documentation in the medical record. I have independently reviewed documentation from referring provider.   06/27/20, MD [place]  [facility]    Medication Adjustments/Labs and Tests Ordered: Current medicines are reviewed at length with the patient today.  Concerns regarding medicines are outlined above.   Orders Placed This Encounter  Procedures  . EKG 12-Lead    No orders of the defined types were placed in this encounter.   Patient Instructions  Medication Instructions:  No Changes In Medications at this time.  *If you need a refill on your cardiac medications before your next appointment, please call your pharmacy*  Follow-Up: At [facility], you and your health needs are our priority.  As part of our continuing mission to provide you with exceptional heart care, we have created designated Provider Care Teams.  These Care Teams include your primary Cardiologist (physician) and Advanced Practice Providers (APPs -  Physician Assistants and Nurse Practitioners) who all work together to provide you with the care you need, when you need it.  Your next appointment:   6 month(s)  The format for your next appointment:   In  Person  Provider:   Weston Brass, MD

## 2020-10-24 ENCOUNTER — Encounter: Payer: Medicare Other | Admitting: Family Medicine

## 2020-10-30 ENCOUNTER — Encounter: Payer: Self-pay | Admitting: Family Medicine

## 2020-10-30 ENCOUNTER — Other Ambulatory Visit: Payer: Self-pay

## 2020-10-30 ENCOUNTER — Ambulatory Visit (INDEPENDENT_AMBULATORY_CARE_PROVIDER_SITE_OTHER): Payer: Medicare Other | Admitting: Family Medicine

## 2020-10-30 VITALS — BP 136/82 | HR 88 | Temp 98.5°F | Resp 16 | Ht 66.0 in | Wt 167.2 lb

## 2020-10-30 DIAGNOSIS — M810 Age-related osteoporosis without current pathological fracture: Secondary | ICD-10-CM | POA: Diagnosis not present

## 2020-10-30 DIAGNOSIS — Z79899 Other long term (current) drug therapy: Secondary | ICD-10-CM | POA: Diagnosis not present

## 2020-10-30 DIAGNOSIS — Z0000 Encounter for general adult medical examination without abnormal findings: Secondary | ICD-10-CM

## 2020-10-30 DIAGNOSIS — F5101 Primary insomnia: Secondary | ICD-10-CM

## 2020-10-30 DIAGNOSIS — G609 Hereditary and idiopathic neuropathy, unspecified: Secondary | ICD-10-CM

## 2020-10-30 DIAGNOSIS — I480 Paroxysmal atrial fibrillation: Secondary | ICD-10-CM

## 2020-10-30 DIAGNOSIS — E039 Hypothyroidism, unspecified: Secondary | ICD-10-CM

## 2020-10-30 DIAGNOSIS — M170 Bilateral primary osteoarthritis of knee: Secondary | ICD-10-CM

## 2020-10-30 DIAGNOSIS — Z7901 Long term (current) use of anticoagulants: Secondary | ICD-10-CM

## 2020-10-30 DIAGNOSIS — R030 Elevated blood-pressure reading, without diagnosis of hypertension: Secondary | ICD-10-CM

## 2020-10-30 MED ORDER — GABAPENTIN 100 MG PO CAPS: 100.0000 mg | ORAL_CAPSULE | Freq: Every day | ORAL | 0 refills | Status: DC

## 2020-10-30 NOTE — Progress Notes (Signed)
Subjective  Chief Complaint  Patient presents with  . Annual Exam    Fasting   . Hypothyroidism  . Peripheral Neuropathy    Difficult to fall asleep due to pain    HPI: Cathy Lambert is a 87 y.o. female who presents to [facility] [place] today for a Female Wellness Visit. She also has the concerns and/or needs as listed above in the chief complaint. These will be addressed in addition to the Health Maintenance Visit.   Wellness Visit: annual visit with health maintenance review and exam without Pap   Health maintenance: Independent 87 year old female doing well overall.  No new concerns.  Immunizations are up-to-date.  She feels well. Chronic disease f/u and/or acute problem visit: (deemed necessary to be done in addition to the wellness visit):  History of whitecoat syndrome: Blood pressures remain normal in the office now.  Feels less anxious overall.  History of chronic prescription benzo use for sleep however over the last 6 to 8 months she has decreased use to only twice a week.  No longer feeling that she needs it.  Has questions about how to stop it.  Idiopathic peripheral neuropathy: Now more symptomatic.  Causing sleep disruption due to her numbness and tingling.  No pain or foot sores.  Has never used medications.  History of A. fib/a flutter on long-term anticoagulation doing well.  Status post cardioversion.  Reviewed cardiology notes.  No chest pain.  No palpitations.  Hypothyroidism on daily supplements as prescribed.  Feels energy is good.  No symptoms of high or low thyroid.  Stable weight.  Osteoporosis: Will be due for bone density in September.  Currently taking calcium and vitamin D and has been exercising.  Would like to defer prescription therapies if possible.  Knee OA: Mild discomfort.  No longer having significant pain or swelling.  Has had steroid injection in the past which worked very well.  No locking or give way   Assessment  1.  Annual physical exam   2. White coat syndrome without diagnosis of hypertension   3. Chronic prescription benzodiazepine use   4. Current use of long term anticoagulation   5. Age-related osteoporosis without current pathological fracture   6. Acquired hypothyroidism   7. Primary insomnia   8. Peripheral neuropathy, idiopathic   9. Primary osteoarthritis of both knees   10. Paroxysmal atrial fibrillation [facility])      Plan  Female Wellness Visit:  Age appropriate Health Maintenance and Prevention measures were discussed with patient. Included topics are cancer screening recommendations, ways to keep healthy (see AVS) including dietary and exercise recommendations, regular eye and dental care, use of seat belts, and avoidance of moderate alcohol use and tobacco use.   BMI: discussed patient's BMI and encouraged positive lifestyle modifications to help get to or maintain a target BMI.  HM needs and immunizations were addressed and ordered. See below for orders. See HM and immunization section for updates.  Up-to-date  Routine labs and screening tests ordered including cmp, cbc and lipids where appropriate.  Discussed recommendations regarding Vit D and calcium supplementation (see AVS)  Chronic disease management visit and/or acute problem visit:  Blood pressure is normal.  History of chronic benzo use: Recommend stopping.  Peripheral neuropathy interfering with sleep: Educated on gabapentin use.  Will start 100 mg nightly and titrate up to 300 mg nightly.  Follow-up if not improving.  Recheck thyroid levels.  Clinically euthyroid.  A. fib/A-flutter on long-term  anticoagulation: Stable.  Continue calcium channel blocker for rate control and add beta-blocker as needed.   Follow up: 6 months for recheck Orders Placed This Encounter  Procedures  . DG Bone Density  . CBC with Differential/Platelet  . Comprehensive metabolic panel  . Lipid panel  . TSH   Meds ordered this encounter   Medications  . gabapentin (NEURONTIN) 100 MG capsule    Sig: Take 1-3 capsules (100-300 mg total) by mouth at bedtime.    Dispense:  90 capsule    Refill:  0      Body mass index is 26.99 kg/m. Wt Readings from Last 3 Encounters:  10/30/20 167 lb 3.2 oz (75.8 kg)  09/12/20 169 lb 6.4 oz (76.8 kg)  08/28/20 172 lb (78 kg)     Patient Active Problem List   Diagnosis Date Noted  . White coat syndrome without diagnosis of hypertension 10/25/2019    Priority: High  . Chronic prescription benzodiazepine use 05/25/2019    Priority: High  . Current use of long term anticoagulation 05/25/2019    Priority: High  . Osteoporosis 05/10/2019    Priority: High    dexa 04/2019, lowest T = -2.6 at hip. Discussed tx options 10/2019: pt declined. Reassess 04/2021 and offer again if progressing.   . Atrial fibrillation (HCC)     Priority: High  . Wide-complex tachycardia (HCC) 03/04/2019    Priority: High  . Acquired hypothyroidism 10/27/2017    Priority: High  . Primary insomnia 05/25/2019    Priority: Medium  . Peripheral neuropathy, idiopathic 06/24/2018    Priority: Medium  . Primary osteoarthritis of knee     Priority: Medium  . Patellofemoral pain syndrome of right knee 06/24/2018    Priority: Low  . History of migraine - none since age 49 10/27/2017    Priority: Low  . Secondary hypercoagulable state (HCC) 12/14/2019  . Atrial flutter (HCC) 09/26/2019    Health Maintenance  Topic Date Due  . DEXA SCAN  05/09/2021  . TETANUS/TDAP  06/16/2022  . INFLUENZA VACCINE  Completed  . COVID-19 Vaccine  Completed  . PNA vac Low Risk Adult  Completed  . HPV VACCINES  Aged Out   Immunization History  Administered Date(s) Administered  . Fluad Quad(high Dose 65+) 04/27/2020  . Influenza, High Dose Seasonal PF 06/07/2017  . Influenza,inj,Quad PF,6+ Mos 06/24/2018  . PFIZER(Purple Top)SARS-COV-2 Vaccination 09/08/2019, 09/27/2019, 05/16/2020  . Zoster 11/06/2007  . Zoster  Recombinat (Shingrix) 08/20/2019, 01/18/2020   We updated and reviewed the patient's past history in detail and it is documented below. Allergies: Patient has No Known Allergies. Past Medical History Patient  has a past medical history of Atrial fibrillation (HCC), History of migraine - none since age 52 (10/27/2017), Osteoporosis (05/10/2019), Primary osteoarthritis of knee, and Thyroid disease. Past Surgical History Patient  has a past surgical history that includes Bunionectomy (1991); Cataract extraction (2008-2009); Tonsillectomy and adenoidectomy (1953); TEE without cardioversion (N/A, 03/08/2019); Cardioversion (N/A, 03/08/2019); and Cardioversion (N/A, 08/28/2020). Family History: Patient family history includes Arthritis in her mother; Cancer in her maternal grandmother, mother, paternal grandfather, sister, and son; Diabetes in her brother; Hearing loss in her sister; Stroke in her maternal grandfather. Social History:  Patient  reports that she has never smoked. She has never used smokeless tobacco. She reports current alcohol use of about 1.0 standard drink of alcohol per week. She reports that she does not use drugs.  Review of Systems: Constitutional: negative for fever or malaise Ophthalmic: negative  for photophobia, double vision or loss of vision Cardiovascular: negative for chest pain, dyspnea on exertion, or new LE swelling Respiratory: negative for SOB or persistent cough Gastrointestinal: negative for abdominal pain, change in bowel habits or melena Genitourinary: negative for dysuria or gross hematuria, no abnormal uterine bleeding or disharge Musculoskeletal: negative for new gait disturbance or muscular weakness Integumentary: negative for new or persistent rashes, no breast lumps Neurological: negative for TIA or stroke symptoms Psychiatric: negative for SI or delusions Allergic/Immunologic: negative for hives  Patient Care Team    Relationship Specialty Notifications  Start End  Willow Ora, MD PCP - General Family Medicine  10/27/17   Parke Poisson, MD PCP - Cardiology Cardiology Admissions 03/08/19   Mateo Flow, MD Consulting Physician Ophthalmology  10/25/19     Objective  Vitals: BP 136/82   Pulse 88   Temp 98.5 F (36.9 C) (Temporal)   Resp 16   Ht 5\' 6"  (1.676 m)   Wt 167 lb 3.2 oz (75.8 kg)   SpO2 97%   BMI 26.99 kg/m  General:  Well developed, well nourished, no acute distress, appears younger than stated age Psych:  Alert and orientedx3,normal mood and affect HEENT:  Normocephalic, atraumatic, non-icteric sclera,  supple neck without adenopathy, mass or thyromegaly Cardiovascular:  Normal S1, S2, RRR without gallop, rub or murmur Respiratory:  Good breath sounds bilaterally, CTAB with normal respiratory effort Gastrointestinal: normal bowel sounds, soft, non-tender, no noted masses. No HSM  Skin:  Warm, no rashes or suspicious lesions noted Neurologic:    Mental status is normal. CN 2-11 are normal. Gross motor and sensory exams are normal. Normal gait. No tremor   Commons side effects, risks, benefits, and alternatives for medications and treatment plan prescribed today were discussed, and the patient expressed understanding of the given instructions. Patient is instructed to call or message via MyChart if he/she has any questions or concerns regarding our treatment plan. No barriers to understanding were identified. We discussed Red Flag symptoms and signs in detail. Patient expressed understanding regarding what to do in case of urgent or emergency type symptoms.   Medication list was reconciled, printed and provided to the patient in AVS. Patient instructions and summary information was reviewed with the patient as documented in the AVS. This note was prepared with assistance of Dragon voice recognition software. Occasional wrong-word or sound-a-like substitutions may have occurred due to the inherent limitations of voice  recognition software  This visit occurred during the SARS-CoV-2 public health emergency.  Safety protocols were in place, including screening questions prior to the visit, additional usage of staff PPE, and extensive cleaning of exam room while observing appropriate contact time as indicated for disinfecting solutions.

## 2020-10-30 NOTE — Patient Instructions (Addendum)
Please return in 6 months for recheck.   Please start the gabapentin and see if you can increase the dose to 300mg  nightly to help with sleep and neuropathy symptoms. Let me know how you do.   I will release your lab results to you on your MyChart account with further instructions. Please reply with any questions.   If you have any questions or concerns, please don't hesitate to send me a message via MyChart or call the office at [phone]. Thank you for visiting with [phone] today! It's our pleasure caring for you.  We will get you scheduled for your bone density in September.

## 2020-10-31 LAB — CBC WITH DIFFERENTIAL/PLATELET
Basophils Absolute: 0.1 10*3/uL (ref 0.0–0.1)
Basophils Relative: 1.1 % (ref 0.0–3.0)
Eosinophils Absolute: 0.2 10*3/uL (ref 0.0–0.7)
Eosinophils Relative: 3.2 % (ref 0.0–5.0)
HCT: 42.2 % (ref 36.0–46.0)
Hemoglobin: 14.3 g/dL (ref 12.0–15.0)
Lymphocytes Relative: 37 % (ref 12.0–46.0)
Lymphs Abs: 2.5 10*3/uL (ref 0.7–4.0)
MCHC: 33.8 g/dL (ref 30.0–36.0)
MCV: 90.9 fl (ref 78.0–100.0)
Monocytes Absolute: 0.5 10*3/uL (ref 0.1–1.0)
Monocytes Relative: 8 % (ref 3.0–12.0)
Neutro Abs: 3.5 10*3/uL (ref 1.4–7.7)
Neutrophils Relative %: 50.7 % (ref 43.0–77.0)
Platelets: 252 10*3/uL (ref 150.0–400.0)
RBC: 4.64 Mil/uL (ref 3.87–5.11)
RDW: 14.7 % (ref 11.5–15.5)
WBC: 6.8 10*3/uL (ref 4.0–10.5)

## 2020-10-31 LAB — COMPREHENSIVE METABOLIC PANEL
ALT: 19 U/L (ref 0–35)
AST: 20 U/L (ref 0–37)
Albumin: 4.2 g/dL (ref 3.5–5.2)
Alkaline Phosphatase: 68 U/L (ref 39–117)
BUN: 15 mg/dL (ref 6–23)
CO2: 26 mEq/L (ref 19–32)
Calcium: 9.5 mg/dL (ref 8.4–10.5)
Chloride: 104 mEq/L (ref 96–112)
Creatinine, Ser: 0.97 mg/dL (ref 0.40–1.20)
GFR: 52.59 mL/min — ABNORMAL LOW (ref >60.00)
Glucose, Bld: 99 mg/dL (ref 70–99)
Potassium: 4.4 mEq/L (ref 3.5–5.1)
Sodium: 138 mEq/L (ref 135–145)
Total Bilirubin: 0.7 mg/dL (ref 0.2–1.2)
Total Protein: 6.8 g/dL (ref 6.0–8.3)

## 2020-10-31 LAB — LIPID PANEL
Cholesterol: 215 mg/dL — ABNORMAL HIGH (ref 0–200)
HDL: 69 mg/dL (ref >39.00)
LDL Cholesterol: 129 mg/dL — ABNORMAL HIGH (ref 0–99)
NonHDL: 146.1
Total CHOL/HDL Ratio: 3
Triglycerides: 85 mg/dL (ref 0.0–149.0)
VLDL: 17 mg/dL (ref 0.0–40.0)

## 2020-10-31 LAB — TSH: TSH: 2.06 u[IU]/mL (ref 0.35–4.50)

## 2020-11-08 ENCOUNTER — Telehealth: Payer: Self-pay | Admitting: Internal Medicine

## 2020-11-08 NOTE — Telephone Encounter (Signed)
Returned call to patient-traveling to [place] in June for 2 weeks and would like to get referral to cardiology in case needed while she is there.   She states cardiologist is aware and would like to have a "introductory" meeting the morning after she arrives.      Advised would send to Dr. Jacques Navy to review.

## 2020-11-08 NOTE — Telephone Encounter (Signed)
No referral is needed. Last two cardiology office notes and most recent echo can be sent to her cardiologist of choice in [place].

## 2020-11-08 NOTE — Telephone Encounter (Signed)
Cathy Lambert is calling stating she is going to be traveling to [place] and is requesting a referral be sent for her incase she needs heart care while down there. She would like the referral to go to Dr. Jeannene Patella with [facility] in [place]. Phone number: [phone] Secondary Phone [phone]. Please advise.

## 2020-11-13 ENCOUNTER — Telehealth (HOSPITAL_COMMUNITY): Payer: Self-pay | Admitting: *Deleted

## 2020-11-13 ENCOUNTER — Telehealth: Payer: Self-pay | Admitting: Internal Medicine

## 2020-11-13 NOTE — Telephone Encounter (Signed)
Left detailed message for patient-advised no referral needed and records to be faxed to Dr. Rachael Fee in [place].     Advised to call back with questions or concerns.

## 2020-11-13 NOTE — H&P (View-Only) (Signed)
Primary Care Physician: Willow Ora, MD Primary Cardiologist: Dr Jacques Navy Primary Electrophysiologist: none Referring Physician: Dr [name] is a 87 y.o. female with a history of hypothyroidism, migraines and atrial flutter who presents for follow up in the [facility].  The patient was initially diagnosed with atrial flutter after presenting to the ER on 03/04/19 with symptoms of heart racing in the 140s Bpm. No other associated symptoms, but in hindsight she does note that she was more fatigued. There were no triggers that the patient could identify. She underwent TEE/DCCV after being loaded on IV amiodarone. Patient's amiodarone was stopped 2/2 abnormal thyroid panel. She is s/p repeat DCCV on 08/28/20.  On follow up today, patient reports that she was back out of rhythm on 11/12/20 with symptoms of heart racing. She increased her diltiazem to BID but this has not really helped her heart rate. She is in rapid atrial flutter today. There were no specific triggers that she could identify.   Today, she denies symptoms of chest pain, shortness of breath, orthopnea, PND, lower extremity edema, dizziness, presyncope, syncope, snoring, daytime somnolence, bleeding, or neurologic sequela. The patient is tolerating medications without difficulties and is otherwise without complaint today.    Atrial Fibrillation Risk Factors:  she does not have symptoms or diagnosis of sleep apnea. she does not have a history of rheumatic fever. she does not have a history of alcohol use. The patient does not have a history of early familial atrial fibrillation or other arrhythmias.  she has a BMI of Body mass index is 27.31 kg/m.[name] Kitchen Filed Weights   11/14/20 1032  Weight: 76.7 kg    Family History  Problem Relation Age of Onset  . Arthritis Mother   . Cancer Mother   . Cancer Sister   . Diabetes Brother   . Cancer Maternal Grandmother   . Stroke Maternal  Grandfather   . Cancer Paternal Grandfather   . Hearing loss Sister   . Cancer Son      Atrial Fibrillation Management history:  Previous antiarrhythmic drugs: amiodarone  Previous cardioversions: 03/08/19, 08/28/20 Previous ablations: none CHADS2VASC score: 3 Anticoagulation history: Eliquis   Past Medical History:  Diagnosis Date  . Atrial fibrillation (HCC)   . History of migraine - none since age 43 10/27/2017  . Osteoporosis 05/10/2019   dexa 04/2019, lowest T = -2.6 at hip. To discuss 10/20 and offer tx.  . Primary osteoarthritis of knee   . Thyroid disease    Past Surgical History:  Procedure Laterality Date  . BUNIONECTOMY  1991  . CARDIOVERSION N/A 03/08/2019   Procedure: CARDIOVERSION;  Surgeon: Elease Hashimoto Deloris Ping, MD;  Location: [facility] ENDOSCOPY;  Service: Cardiovascular;  Laterality: N/A;  . CARDIOVERSION N/A 08/28/2020   Procedure: CARDIOVERSION;  Surgeon: Meriam Sprague, MD;  Location: [facility] ENDOSCOPY;  Service: Cardiovascular;  Laterality: N/A;  . CATARACT EXTRACTION  2008-2009  . TEE WITHOUT CARDIOVERSION N/A 03/08/2019   Procedure: TRANSESOPHAGEAL ECHOCARDIOGRAM (TEE);  Surgeon: Elease Hashimoto Deloris Ping, MD;  Location: [facility] ENDOSCOPY;  Service: Cardiovascular;  Laterality: N/A;  . TONSILLECTOMY AND ADENOIDECTOMY  1953    Current Outpatient Medications  Medication Sig Dispense Refill  . acetaminophen (TYLENOL) 500 MG tablet Take 500 mg by mouth daily as needed for headache or mild pain.    [name] Kitchen apixaban (ELIQUIS) 5 MG TABS tablet TAKE 1 TABLET(5 MG) BY MOUTH TWICE DAILY 180 tablet 2  . B Complex Vitamins (VITAMIN B COMPLEX  PO) Take 50 mg by mouth daily with breakfast.    . calcium citrate-vitamin D (CITRACAL+D) 315-200 MG-UNIT tablet Take 1 tablet by mouth in the morning and at bedtime.    [name] Kitchen diltiazem (CARDIZEM CD) 120 MG 24 hr capsule Take 1 tablet by mouth once a day. Can take extra tablet daily for breakthrough afib. 60 capsule 2  . levothyroxine (SYNTHROID) 50 MCG tablet TAKE  1 TABLET BY MOUTH IN THE MORNING BEFORE BREAKFAST ON SUNDAY/MONDAY/WEDNESDAY/FRIDAY/SATURDAY AND 2 TABLETS ON TUESDAYS AND THURSDAYS (Patient taking differently: Take 50 mcg by mouth See admin instructions. Take 50 mg TABLET BY MOUTH IN THE MORNING BEFORE BREAKFAST ON SUNDAY/MONDAY/WEDNESDAY/FRIDAY/SATURDAY AND 100 mg TABLETS ON TUESDAYS AND THURSDAYS) 450 tablet 2   No current facility-administered medications for this encounter.    No Known Allergies  Social History   Socioeconomic History  . Marital status: Divorced    Spouse name: Not on file  . Number of children: Not on file  . Years of education: Not on file  . Highest education level: Not on file  Occupational History  . Occupation: Retired     Comment: Warden/ranger   Tobacco Use  . Smoking status: Never Smoker  . Smokeless tobacco: Never Used  Vaping Use  . Vaping Use: Never used  Substance and Sexual Activity  . Alcohol use: Yes    Alcohol/week: 1.0 standard drink    Types: 1 Cans of beer per week    Comment: once a month  . Drug use: No  . Sexual activity: Never    Birth control/protection: Post-menopausal  Other Topics Concern  . Not on file  Social History Narrative   Enjoys spending time with neighbors    Social Determinants of Health   Financial Resource Strain: Not on file  Food Insecurity: Not on file  Transportation Needs: Not on file  Physical Activity: Not on file  Stress: Not on file  Social Connections: Not on file  Intimate Partner Violence: Not on file     ROS- All systems are reviewed and negative except as per the HPI above.  Physical Exam: Vitals:   11/14/20 1032  BP: (!) 114/92  Pulse: (!) 152  Weight: 76.7 kg  Height: 5\' 6"  (1.676 m)    GEN- The patient is a well appearing elderly female, alert and oriented x 3 today.   HEENT-head normocephalic, atraumatic, sclera clear, conjunctiva pink, hearing intact, trachea midline. Lungs- Clear to ausculation bilaterally, normal work of  breathing Heart- Regular rate and rhythm, tachycardia, no murmurs, rubs or gallops  GI- soft, NT, ND, + BS Extremities- no clubbing, cyanosis, or edema MS- no significant deformity or atrophy Skin- no rash or lesion Psych- euthymic mood, full affect Neuro- strength and sensation are intact   Wt Readings from Last 3 Encounters:  11/14/20 76.7 kg  10/30/20 75.8 kg  09/12/20 76.8 kg    EKG today demonstrates  Atrial flutter with 2:1 block Vent. rate 152 BPM PR interval * ms QRS duration 118 ms QT/QTcB 326/518 ms  TEE 03/08/19 1. No evidence of a thrombus present in the left atrial appendage.  2. The aortic valve is grossly normal Aortic valve regurgitation is mild by color flow Doppler.  3. The tricuspid valve was grossly normal.  4. The aortic root is normal in size and structure.  5. There is evidence of mild plaque in the descending aorta.  6. The left ventricle has normal systolic function, with an ejection fraction of 55-60%.  FINDINGS  Left Ventricle: The left ventricle has normal systolic function, with an ejection fraction of 55-60%.  TEE documentation note mentioned EF 35-40%   Echo 06/22/19 1. Left ventricular ejection fraction, by visual estimation, is 60 to  65%. The left ventricle has normal function. There is no left ventricular  hypertrophy.  2. Left ventricular diastolic parameters are indeterminate.  3. Global right ventricle has normal systolic function.The right  ventricular size is normal. No increase in right ventricular wall  thickness.  4. Left atrial size was normal.  5. Right atrial size was normal.  6. The mitral valve is normal in structure. No evidence of mitral valve  regurgitation. No evidence of mitral stenosis.  7. The tricuspid valve is normal in structure. Tricuspid valve  regurgitation is trivial.  8. The aortic valve was not assessed. Aortic valve regurgitation is mild.  No evidence of aortic valve sclerosis or stenosis.   9. The pulmonic valve was normal in structure. Pulmonic valve  regurgitation is trivial.  10. Normal pulmonary artery systolic pressure.  11. The atrial septum is grossly normal.    Epic records are reviewed at length today  CHA2DS2-VASc Score = 3  The patient's score is based upon: CHF History: No HTN History: No Diabetes History: No Stroke History: No Vascular Disease History: No Age Score: 2 Gender Score: 1      ASSESSMENT AND PLAN: 1. Atypical atrial flutter The patient's CHA2DS2-VASc score is 3, indicating a 3.2% annual risk of stroke.   Patient in rapid atrial flutter.  We discussed therapeutic options today including AAD (Multaq, flecainide, dofetilide) and repeat DCCV.  Will start flecainide 50 mg BID and schedule DCCV.  Continue diltiazem 120 mg BID until DCCV then decreased back to daily. Continue Eliquis 5 mg BID. Patient denies any missed doses in the last 3 weeks. Check bmet/CBC  2. Secondary Hypercoagulable State (ICD10:  D68.69) The patient is at significant risk for stroke/thromboembolism based upon her CHA2DS2-VASc Score of 3.  Continue Apixaban (Eliquis).     Follow up in the AF clinic post DCCV.    Ricky Kimsey Demaree PA-C [facility] [address] [place], [place] [address] [phone] 11/14/2020 11:11 AM 

## 2020-11-13 NOTE — Telephone Encounter (Signed)
Patient noticed her HRs were up yesterday morning in the 140s. She took an extra dose of cardizem 120mg  this morning BP 107/81 HR 142. Slightly "woozy" when she first goes from sitting to standing but otherwise asymptomatic.  Discussed with PA - will bring in for assessment tomorrow - to ER if symptoms progress. Pt in agreement.

## 2020-11-13 NOTE — Telephone Encounter (Signed)
Patient was returning phone call that was left

## 2020-11-13 NOTE — Telephone Encounter (Signed)
Patient has been made aware of the message.   She will be moving to [facility] April 11th and wanted to know if Dr. Jacques Navy could recommend someone.

## 2020-11-13 NOTE — Progress Notes (Signed)
Primary Care Physician: Willow Ora, MD Primary Cardiologist: Dr Jacques Navy Primary Electrophysiologist: none Referring Physician: Dr [name] is a 87 y.o. female with a history of hypothyroidism, migraines and atrial flutter who presents for follow up in the [facility].  The patient was initially diagnosed with atrial flutter after presenting to the ER on 03/04/19 with symptoms of heart racing in the 140s Bpm. No other associated symptoms, but in hindsight she does note that she was more fatigued. There were no triggers that the patient could identify. She underwent TEE/DCCV after being loaded on IV amiodarone. Patient's amiodarone was stopped 2/2 abnormal thyroid panel. She is s/p repeat DCCV on 08/28/20.  On follow up today, patient reports that she was back out of rhythm on 11/12/20 with symptoms of heart racing. She increased her diltiazem to BID but this has not really helped her heart rate. She is in rapid atrial flutter today. There were no specific triggers that she could identify.   Today, she denies symptoms of chest pain, shortness of breath, orthopnea, PND, lower extremity edema, dizziness, presyncope, syncope, snoring, daytime somnolence, bleeding, or neurologic sequela. The patient is tolerating medications without difficulties and is otherwise without complaint today.    Atrial Fibrillation Risk Factors:  she does not have symptoms or diagnosis of sleep apnea. she does not have a history of rheumatic fever. she does not have a history of alcohol use. The patient does not have a history of early familial atrial fibrillation or other arrhythmias.  she has a BMI of Body mass index is 27.31 kg/m.[name] Kitchen Filed Weights   11/14/20 1032  Weight: 76.7 kg    Family History  Problem Relation Age of Onset  . Arthritis Mother   . Cancer Mother   . Cancer Sister   . Diabetes Brother   . Cancer Maternal Grandmother   . Stroke Maternal  Grandfather   . Cancer Paternal Grandfather   . Hearing loss Sister   . Cancer Son      Atrial Fibrillation Management history:  Previous antiarrhythmic drugs: amiodarone  Previous cardioversions: 03/08/19, 08/28/20 Previous ablations: none CHADS2VASC score: 3 Anticoagulation history: Eliquis   Past Medical History:  Diagnosis Date  . Atrial fibrillation (HCC)   . History of migraine - none since age 43 10/27/2017  . Osteoporosis 05/10/2019   dexa 04/2019, lowest T = -2.6 at hip. To discuss 10/20 and offer tx.  . Primary osteoarthritis of knee   . Thyroid disease    Past Surgical History:  Procedure Laterality Date  . BUNIONECTOMY  1991  . CARDIOVERSION N/A 03/08/2019   Procedure: CARDIOVERSION;  Surgeon: Elease Hashimoto Deloris Ping, MD;  Location: [facility] ENDOSCOPY;  Service: Cardiovascular;  Laterality: N/A;  . CARDIOVERSION N/A 08/28/2020   Procedure: CARDIOVERSION;  Surgeon: Meriam Sprague, MD;  Location: [facility] ENDOSCOPY;  Service: Cardiovascular;  Laterality: N/A;  . CATARACT EXTRACTION  2008-2009  . TEE WITHOUT CARDIOVERSION N/A 03/08/2019   Procedure: TRANSESOPHAGEAL ECHOCARDIOGRAM (TEE);  Surgeon: Elease Hashimoto Deloris Ping, MD;  Location: [facility] ENDOSCOPY;  Service: Cardiovascular;  Laterality: N/A;  . TONSILLECTOMY AND ADENOIDECTOMY  1953    Current Outpatient Medications  Medication Sig Dispense Refill  . acetaminophen (TYLENOL) 500 MG tablet Take 500 mg by mouth daily as needed for headache or mild pain.    [name] Kitchen apixaban (ELIQUIS) 5 MG TABS tablet TAKE 1 TABLET(5 MG) BY MOUTH TWICE DAILY 180 tablet 2  . B Complex Vitamins (VITAMIN B COMPLEX  PO) Take 50 mg by mouth daily with breakfast.    . calcium citrate-vitamin D (CITRACAL+D) 315-200 MG-UNIT tablet Take 1 tablet by mouth in the morning and at bedtime.    [name] Kitchen diltiazem (CARDIZEM CD) 120 MG 24 hr capsule Take 1 tablet by mouth once a day. Can take extra tablet daily for breakthrough afib. 60 capsule 2  . levothyroxine (SYNTHROID) 50 MCG tablet TAKE  1 TABLET BY MOUTH IN THE MORNING BEFORE BREAKFAST ON SUNDAY/MONDAY/WEDNESDAY/FRIDAY/SATURDAY AND 2 TABLETS ON TUESDAYS AND THURSDAYS (Patient taking differently: Take 50 mcg by mouth See admin instructions. Take 50 mg TABLET BY MOUTH IN THE MORNING BEFORE BREAKFAST ON SUNDAY/MONDAY/WEDNESDAY/FRIDAY/SATURDAY AND 100 mg TABLETS ON TUESDAYS AND THURSDAYS) 450 tablet 2   No current facility-administered medications for this encounter.    No Known Allergies  Social History   Socioeconomic History  . Marital status: Divorced    Spouse name: Not on file  . Number of children: Not on file  . Years of education: Not on file  . Highest education level: Not on file  Occupational History  . Occupation: Retired     Comment: Warden/ranger   Tobacco Use  . Smoking status: Never Smoker  . Smokeless tobacco: Never Used  Vaping Use  . Vaping Use: Never used  Substance and Sexual Activity  . Alcohol use: Yes    Alcohol/week: 1.0 standard drink    Types: 1 Cans of beer per week    Comment: once a month  . Drug use: No  . Sexual activity: Never    Birth control/protection: Post-menopausal  Other Topics Concern  . Not on file  Social History Narrative   Enjoys spending time with neighbors    Social Determinants of Health   Financial Resource Strain: Not on file  Food Insecurity: Not on file  Transportation Needs: Not on file  Physical Activity: Not on file  Stress: Not on file  Social Connections: Not on file  Intimate Partner Violence: Not on file     ROS- All systems are reviewed and negative except as per the HPI above.  Physical Exam: Vitals:   11/14/20 1032  BP: (!) 114/92  Pulse: (!) 152  Weight: 76.7 kg  Height: 5\' 6"  (1.676 m)    GEN- The patient is a well appearing elderly female, alert and oriented x 3 today.   HEENT-head normocephalic, atraumatic, sclera clear, conjunctiva pink, hearing intact, trachea midline. Lungs- Clear to ausculation bilaterally, normal work of  breathing Heart- Regular rate and rhythm, tachycardia, no murmurs, rubs or gallops  GI- soft, NT, ND, + BS Extremities- no clubbing, cyanosis, or edema MS- no significant deformity or atrophy Skin- no rash or lesion Psych- euthymic mood, full affect Neuro- strength and sensation are intact   Wt Readings from Last 3 Encounters:  11/14/20 76.7 kg  10/30/20 75.8 kg  09/12/20 76.8 kg    EKG today demonstrates  Atrial flutter with 2:1 block Vent. rate 152 BPM PR interval * ms QRS duration 118 ms QT/QTcB 326/518 ms  TEE 03/08/19 1. No evidence of a thrombus present in the left atrial appendage.  2. The aortic valve is grossly normal Aortic valve regurgitation is mild by color flow Doppler.  3. The tricuspid valve was grossly normal.  4. The aortic root is normal in size and structure.  5. There is evidence of mild plaque in the descending aorta.  6. The left ventricle has normal systolic function, with an ejection fraction of 55-60%.  FINDINGS  Left Ventricle: The left ventricle has normal systolic function, with an ejection fraction of 55-60%.  TEE documentation note mentioned EF 35-40%   Echo 06/22/19 1. Left ventricular ejection fraction, by visual estimation, is 60 to  65%. The left ventricle has normal function. There is no left ventricular  hypertrophy.  2. Left ventricular diastolic parameters are indeterminate.  3. Global right ventricle has normal systolic function.The right  ventricular size is normal. No increase in right ventricular wall  thickness.  4. Left atrial size was normal.  5. Right atrial size was normal.  6. The mitral valve is normal in structure. No evidence of mitral valve  regurgitation. No evidence of mitral stenosis.  7. The tricuspid valve is normal in structure. Tricuspid valve  regurgitation is trivial.  8. The aortic valve was not assessed. Aortic valve regurgitation is mild.  No evidence of aortic valve sclerosis or stenosis.   9. The pulmonic valve was normal in structure. Pulmonic valve  regurgitation is trivial.  10. Normal pulmonary artery systolic pressure.  11. The atrial septum is grossly normal.    Epic records are reviewed at length today  CHA2DS2-VASc Score = 3  The patient's score is based upon: CHF History: No HTN History: No Diabetes History: No Stroke History: No Vascular Disease History: No Age Score: 2 Gender Score: 1      ASSESSMENT AND PLAN: 1. Atypical atrial flutter The patient's CHA2DS2-VASc score is 3, indicating a 3.2% annual risk of stroke.   Patient in rapid atrial flutter.  We discussed therapeutic options today including AAD (Multaq, flecainide, dofetilide) and repeat DCCV.  Will start flecainide 50 mg BID and schedule DCCV.  Continue diltiazem 120 mg BID until DCCV then decreased back to daily. Continue Eliquis 5 mg BID. Patient denies any missed doses in the last 3 weeks. Check bmet/CBC  2. Secondary Hypercoagulable State (ICD10:  D68.69) The patient is at significant risk for stroke/thromboembolism based upon her CHA2DS2-VASc Score of 3.  Continue Apixaban (Eliquis).     Follow up in the AF clinic post DCCV.    Jorja Loa PA-C [facility] [address] [place], [place] [address] [phone] 11/14/2020 11:11 AM

## 2020-11-14 ENCOUNTER — Other Ambulatory Visit: Payer: Self-pay

## 2020-11-14 ENCOUNTER — Encounter (HOSPITAL_COMMUNITY): Payer: Self-pay | Admitting: Physician Assistant

## 2020-11-14 ENCOUNTER — Ambulatory Visit (HOSPITAL_COMMUNITY)
Admission: RE | Admit: 2020-11-14 | Discharge: 2020-11-14 | Disposition: A | Discharge status: Home / Self Care | Payer: Medicare Other | Source: Ambulatory Visit | Attending: Physician Assistant | Admitting: Physician Assistant

## 2020-11-14 ENCOUNTER — Other Ambulatory Visit (HOSPITAL_COMMUNITY)
Admission: RE | Admit: 2020-11-14 | Discharge: 2020-11-14 | Disposition: A | Discharge status: Home / Self Care | Payer: Medicare Other | Source: Ambulatory Visit | Attending: Physician Assistant | Admitting: Physician Assistant

## 2020-11-14 VITALS — BP 114/92 | HR 152 | Ht 66.0 in | Wt 169.2 lb

## 2020-11-14 DIAGNOSIS — Z7901 Long term (current) use of anticoagulants: Secondary | ICD-10-CM | POA: Insufficient documentation

## 2020-11-14 DIAGNOSIS — Z20822 Contact with and (suspected) exposure to covid-19: Secondary | ICD-10-CM | POA: Insufficient documentation

## 2020-11-14 DIAGNOSIS — I471 Supraventricular tachycardia: Secondary | ICD-10-CM | POA: Diagnosis not present

## 2020-11-14 DIAGNOSIS — I484 Atypical atrial flutter: Secondary | ICD-10-CM | POA: Insufficient documentation

## 2020-11-14 DIAGNOSIS — Z79899 Other long term (current) drug therapy: Secondary | ICD-10-CM | POA: Diagnosis not present

## 2020-11-14 DIAGNOSIS — D6869 Other thrombophilia: Secondary | ICD-10-CM | POA: Insufficient documentation

## 2020-11-14 LAB — CBC
HCT: 44.1 % (ref 36.0–46.0)
Hemoglobin: 14.8 g/dL (ref 12.0–15.0)
MCH: 30.6 pg (ref 26.0–34.0)
MCHC: 33.6 g/dL (ref 30.0–36.0)
MCV: 91.1 fL (ref 80.0–100.0)
Platelets: 291 10*3/uL (ref 150–400)
RBC: 4.84 MIL/uL (ref 3.87–5.11)
RDW: 15.3 % (ref 11.5–15.5)
WBC: 9.3 10*3/uL (ref 4.0–10.5)
nRBC: 0 % (ref 0.0–0.2)

## 2020-11-14 LAB — BASIC METABOLIC PANEL
Anion gap: 10 (ref 5–15)
BUN: 16 mg/dL (ref 8–23)
CO2: 22 mmol/L (ref 22–32)
Calcium: 9.4 mg/dL (ref 8.9–10.3)
Chloride: 105 mmol/L (ref 98–111)
Creatinine, Ser: 1.07 mg/dL — ABNORMAL HIGH (ref 0.44–1.00)
GFR, Estimated: 50 mL/min — ABNORMAL LOW (ref >60)
Glucose, Bld: 124 mg/dL — ABNORMAL HIGH (ref 70–99)
Potassium: 4.3 mmol/L (ref 3.5–5.1)
Sodium: 137 mmol/L (ref 135–145)

## 2020-11-14 LAB — SARS CORONAVIRUS 2 (TAT 6-24 HRS): SARS Coronavirus 2: NEGATIVE

## 2020-11-14 MED ORDER — FLECAINIDE ACETATE 50 MG PO TABS: 50.0000 mg | ORAL_TABLET | Freq: Two times a day (BID) | ORAL | 3 refills | Status: DC

## 2020-11-14 NOTE — Patient Instructions (Signed)
Cardioversion scheduled for Wednesday, March 30th  - Arrive at the [facility] and go to admitting at 1:30pm  - Do not eat or drink anything after midnight the night prior to your procedure.  - Take all your morning medication (except diabetic medications) with a sip of water prior to arrival.  - You will not be able to drive home after your procedure.  - Do NOT miss any doses of your blood thinner - if you should miss a dose please notify our office immediately.  - If you feel as if you go back into normal rhythm prior to scheduled cardioversion, please notify our office immediately. If your procedure is canceled in the cardioversion suite you will be charged a cancellation fee.  Start Flecainide 50mg  twice a day - start today

## 2020-11-15 ENCOUNTER — Encounter (HOSPITAL_COMMUNITY): Payer: Self-pay | Admitting: Cardiology

## 2020-11-15 ENCOUNTER — Ambulatory Visit (HOSPITAL_COMMUNITY)
Admission: RE | Admit: 2020-11-15 | Discharge: 2020-11-15 | Disposition: A | Discharge status: Home / Self Care | Payer: Medicare Other | Attending: Cardiology | Admitting: Cardiology

## 2020-11-15 ENCOUNTER — Ambulatory Visit (HOSPITAL_COMMUNITY): Payer: Medicare Other | Admitting: Certified Registered"

## 2020-11-15 ENCOUNTER — Encounter (HOSPITAL_COMMUNITY)
Admission: RE | Disposition: A | Discharge status: Home / Self Care | Payer: Self-pay | Source: Home / Self Care | Attending: Cardiology

## 2020-11-15 ENCOUNTER — Other Ambulatory Visit: Payer: Self-pay

## 2020-11-15 DIAGNOSIS — F5101 Primary insomnia: Secondary | ICD-10-CM | POA: Diagnosis not present

## 2020-11-15 DIAGNOSIS — E039 Hypothyroidism, unspecified: Secondary | ICD-10-CM | POA: Insufficient documentation

## 2020-11-15 DIAGNOSIS — Z7901 Long term (current) use of anticoagulants: Secondary | ICD-10-CM | POA: Diagnosis not present

## 2020-11-15 DIAGNOSIS — I4892 Unspecified atrial flutter: Secondary | ICD-10-CM | POA: Insufficient documentation

## 2020-11-15 DIAGNOSIS — Z79899 Other long term (current) drug therapy: Secondary | ICD-10-CM | POA: Diagnosis not present

## 2020-11-15 DIAGNOSIS — I484 Atypical atrial flutter: Secondary | ICD-10-CM | POA: Diagnosis not present

## 2020-11-15 DIAGNOSIS — D6869 Other thrombophilia: Secondary | ICD-10-CM | POA: Insufficient documentation

## 2020-11-15 DIAGNOSIS — Z79890 Hormone replacement therapy (postmenopausal): Secondary | ICD-10-CM | POA: Diagnosis not present

## 2020-11-15 HISTORY — PX: CARDIOVERSION: SHX1299

## 2020-11-15 SURGERY — CARDIOVERSION
Anesthesia: General

## 2020-11-15 MED ORDER — SODIUM CHLORIDE 0.9 % IV SOLN: INTRAVENOUS | Status: DC | PRN

## 2020-11-15 MED ORDER — LIDOCAINE HCL (CARDIAC) PF 50 MG/5ML IV SOSY
PREFILLED_SYRINGE | INTRAVENOUS | Status: DC | PRN
  Administered 2020-11-15: 40 mg via INTRAVENOUS

## 2020-11-15 MED ORDER — PROPOFOL 10 MG/ML IV BOLUS
INTRAVENOUS | Status: DC | PRN
  Administered 2020-11-15: 100 mg via INTRAVENOUS

## 2020-11-15 NOTE — Interval H&P Note (Signed)
History and Physical Interval Note:  11/15/2020 1:59 PM  Cathy Lambert  has presented today for surgery, with the diagnosis of afib.  The various methods of treatment have been discussed with the patient and family. After consideration of risks, benefits and other options for treatment, the patient has consented to  Procedure(s): CARDIOVERSION (N/A) as a surgical intervention.  The patient's history has been reviewed, patient examined, no change in status, stable for surgery.  I have reviewed the patient's chart and labs.  Questions were answered to the patient's satisfaction.     [employer]

## 2020-11-15 NOTE — Anesthesia Postprocedure Evaluation (Signed)
Anesthesia Post Note  Patient: Cathy Lambert  Procedure(s) Performed: CARDIOVERSION (N/A )     Patient location during evaluation: PACU Anesthesia Type: General Level of consciousness: awake and alert Pain management: pain level controlled Vital Signs Assessment: post-procedure vital signs reviewed and stable Respiratory status: spontaneous breathing, nonlabored ventilation, respiratory function stable and patient connected to nasal cannula oxygen Cardiovascular status: blood pressure returned to baseline and stable Postop Assessment: no apparent nausea or vomiting Anesthetic complications: no   No complications documented.  Last Vitals:  Vitals:   11/15/20 1443 11/15/20 1453  BP: (!) 96/50 (!) 102/52  Pulse: 66 68  Resp: 14 16  Temp: (!) 36.4 C   SpO2: 98% 96%    Last Pain:  Vitals:   11/15/20 1453  TempSrc:   PainSc: 0-No pain                 Earl Lites P Marybelle Giraldo

## 2020-11-15 NOTE — Anesthesia Preprocedure Evaluation (Addendum)
Anesthesia Evaluation  Patient identified by MRN, date of birth, ID band Patient awake    Reviewed: Allergy & Precautions, NPO status , Patient's Chart, lab work & pertinent test results  Airway Mallampati: II  TM Distance: >3 FB Neck ROM: Full    Dental  (+) Teeth Intact   Pulmonary neg pulmonary ROS,    Pulmonary exam normal        Cardiovascular hypertension, + dysrhythmias Atrial Fibrillation  Rhythm:Irregular Rate:Normal     Neuro/Psych negative neurological ROS  negative psych ROS   GI/Hepatic negative GI ROS, Neg liver ROS,   Endo/Other  Hypothyroidism   Renal/GU negative Renal ROS  negative genitourinary   Musculoskeletal  (+) Arthritis , Osteoarthritis,    Abdominal (+)  Abdomen: soft. Bowel sounds: normal.  Peds  Hematology negative hematology ROS (+)   Anesthesia Other Findings   Reproductive/Obstetrics                             Anesthesia Physical Anesthesia Plan  ASA: III  Anesthesia Plan: General   Post-op Pain Management:    Induction: Intravenous  PONV Risk Score and Plan: 3 and Propofol infusion and Treatment may vary due to age or medical condition  Airway Management Planned: Mask  Additional Equipment: None  Intra-op Plan:   Post-operative Plan:   Informed Consent: I have reviewed the patients History and Physical, chart, labs and discussed the procedure including the risks, benefits and alternatives for the proposed anesthesia with the patient or authorized representative who has indicated his/her understanding and acceptance.     Dental advisory given  Plan Discussed with: CRNA  Anesthesia Plan Comments: (ECHO 11/20: 1. Left ventricular ejection fraction, by visual estimation, is 60 to  65%. The left ventricle has normal function. There is no left ventricular  hypertrophy.  2. Left ventricular diastolic parameters are indeterminate.  3.  Global right ventricle has normal systolic function.The right  ventricular size is normal. No increase in right ventricular wall  thickness.  4. Left atrial size was normal.  5. Right atrial size was normal.  6. The mitral valve is normal in structure. No evidence of mitral valve  regurgitation. No evidence of mitral stenosis.  7. The tricuspid valve is normal in structure. Tricuspid valve  regurgitation is trivial.  8. The aortic valve was not assessed. Aortic valve regurgitation is mild.  No evidence of aortic valve sclerosis or stenosis.  9. The pulmonic valve was normal in structure. Pulmonic valve  regurgitation is trivial.  10. Normal pulmonary artery systolic pressure.  11. The atrial septum is grossly normal.  Lab Results      Component                Value               Date                      WBC                      9.3                 11/14/2020                HGB                      14.8  11/14/2020                HCT                      44.1                11/14/2020                MCV                      91.1                11/14/2020                PLT                      291                 11/14/2020           Lab Results      Component                Value               Date                      NA                       137                 11/14/2020                K                        4.3                 11/14/2020                CO2                      22                  11/14/2020                GLUCOSE                  124 (H)             11/14/2020                BUN                      16                  11/14/2020                CREATININE               1.07 (H)            11/14/2020                CALCIUM                  9.4                 11/14/2020  GFRNONAA                 50 (L)              11/14/2020                GFRAA                    56 (L)              04/27/2020          )       Anesthesia  Quick Evaluation

## 2020-11-15 NOTE — Discharge Instructions (Signed)
Electrical Cardioversion Electrical cardioversion is the delivery of a jolt of electricity to restore a normal rhythm to the heart. A rhythm that is too fast or is not regular keeps the heart from pumping well. In this procedure, sticky patches or metal paddles are placed on the chest to deliver electricity to the heart from a device. This procedure may be done in an emergency if:  There is low or no blood pressure as a result of the heart rhythm.  Normal rhythm must be restored as fast as possible to protect the brain and heart from further damage.  It may save a life. This may also be a scheduled procedure for irregular or fast heart rhythms that are not immediately life-threatening. Tell a health care provider about:  Any allergies you have.  All medicines you are taking, including vitamins, herbs, eye drops, creams, and over-the-counter medicines.  Any problems you or family members have had with anesthetic medicines.  Any blood disorders you have.  Any surgeries you have had.  Any medical conditions you have.  Whether you are pregnant or may be pregnant. What are the risks? Generally, this is a safe procedure. However, problems may occur, including:  Allergic reactions to medicines.  A blood clot that breaks free and travels to other parts of your body.  The possible return of an abnormal heart rhythm within hours or days after the procedure.  Your heart stopping (cardiac arrest). This is rare. What happens before the procedure? Medicines  Your health care provider may have you start taking: ? Blood-thinning medicines (anticoagulants) so your blood does not clot as easily. ? Medicines to help stabilize your heart rate and rhythm.  Ask your health care provider about: ? Changing or stopping your regular medicines. This is especially important if you are taking diabetes medicines or blood thinners. ? Taking medicines such as aspirin and ibuprofen. These medicines can  thin your blood. Do not take these medicines unless your health care provider tells you to take them. ? Taking over-the-counter medicines, vitamins, herbs, and supplements. General instructions  Follow instructions from your health care provider about eating or drinking restrictions.  Plan to have someone take you home from the hospital or clinic.  If you will be going home right after the procedure, plan to have someone with you for 24 hours.  Ask your health care provider what steps will be taken to help prevent infection. These may include washing your skin with a germ-killing soap. What happens during the procedure?  An IV will be inserted into one of your veins.  Sticky patches (electrodes) or metal paddles may be placed on your chest.  You will be given a medicine to help you relax (sedative).  An electrical shock will be delivered. The procedure may vary among health care providers and hospitals.   What can I expect after the procedure?  Your blood pressure, heart rate, breathing rate, and blood oxygen level will be monitored until you leave the hospital or clinic.  Your heart rhythm will be watched to make sure it does not change.  You may have some redness on the skin where the shocks were given. Follow these instructions at home:  Do not drive for 24 hours if you were given a sedative during your procedure.  Take over-the-counter and prescription medicines only as told by your health care provider.  Ask your health care provider how to check your pulse. Check it often.  Rest for 48 hours after the procedure   or as told by your health care provider.  Avoid or limit your caffeine use as told by your health care provider.  Keep all follow-up visits as told by your health care provider. This is important. Contact a health care provider if:  You feel like your heart is beating too quickly or your pulse is not regular.  You have a serious muscle cramp that does not go  away. Get help right away if:  You have discomfort in your chest.  You are dizzy or you feel faint.  You have trouble breathing or you are short of breath.  Your speech is slurred.  You have trouble moving an arm or leg on one side of your body.  Your fingers or toes turn cold or blue. Summary  Electrical cardioversion is the delivery of a jolt of electricity to restore a normal rhythm to the heart.  This procedure may be done right away in an emergency or may be a scheduled procedure if the condition is not an emergency.  Generally, this is a safe procedure.  After the procedure, check your pulse often as told by your health care provider. This information is not intended to replace advice given to you by your health care provider. Make sure you discuss any questions you have with your health care provider. Document Revised: 03/08/2019 Document Reviewed: 03/08/2019 Elsevier Patient Education  2021 Elsevier Inc.  

## 2020-11-15 NOTE — Transfer of Care (Signed)
Immediate Anesthesia Transfer of Care Note  Patient: Cathy Lambert  Procedure(s) Performed: CARDIOVERSION (N/A )  Patient Location: Endoscopy Unit  Anesthesia Type:General  Level of Consciousness: awake, alert  and oriented  Airway & Oxygen Therapy: Patient connected to nasal cannula oxygen  Post-op Assessment: Post -op Vital signs reviewed and stable  Post vital signs: stable  Last Vitals:  Vitals Value Taken Time  BP    Temp    Pulse    Resp    SpO2      Last Pain:  Vitals:   11/15/20 1321  TempSrc: Oral  PainSc: 0-No pain         Complications: No complications documented.

## 2020-11-15 NOTE — Anesthesia Procedure Notes (Signed)
Procedure Name: General with mask airway Date/Time: 11/15/2020 2:40 PM Performed by: Dorie Rank, CRNA Pre-anesthesia Checklist: Patient identified, Emergency Drugs available, Suction available, Patient being monitored and Timeout performed Patient Re-evaluated:Patient Re-evaluated prior to induction Preoxygenation: Pre-oxygenation with 100% oxygen Induction Type: IV induction Ventilation: Mask ventilation without difficulty Placement Confirmation: breath sounds checked- equal and bilateral and positive ETCO2 Dental Injury: Teeth and Oropharynx as per pre-operative assessment

## 2020-11-15 NOTE — CV Procedure (Signed)
    Electrical Cardioversion Procedure Note Cathy Lambert [id number] October 07, 1930  Procedure: Electrical Cardioversion Indications:  Atrial Flutter  Time Out: Verified patient identification, verified procedure,medications/allergies/relevent history reviewed, required imaging and test results available.  Performed  Procedure Details  The patient was NPO after midnight. Anesthesia was administered at the beside with propofol.  Cardioversion was performed with synchronized biphasic defibrillation via AP pads with 100 joules.  1 attempt(s) were performed.  The patient converted to normal sinus rhythm. The patient tolerated the procedure well   IMPRESSION:  Successful cardioversion of atrial flutter. 69 bpm post conversion. Pre 125 bpm, on Flecainide 50mg  PO BID.   11/15/2020, 2:36 PM

## 2020-11-16 ENCOUNTER — Encounter (HOSPITAL_COMMUNITY): Payer: Self-pay | Admitting: Cardiology

## 2020-11-17 NOTE — Telephone Encounter (Signed)
Informed patient I have faxed records to [facility] in [place],  [place] [phone] and fax [phone].  Patient voiced her understanding.

## 2020-11-17 NOTE — Telephone Encounter (Signed)
Patient is following up regarding her request for a referral to Dr. Rachael Fee. She states she spoke with their office and they are saying they have not received any records from [place]. She would like to know if we are able to send it again. She is requesting a call back to confirm that it has been sent.

## 2020-11-20 ENCOUNTER — Ambulatory Visit (HOSPITAL_COMMUNITY)
Admission: RE | Admit: 2020-11-20 | Discharge: 2020-11-20 | Disposition: A | Discharge status: Home / Self Care | Payer: Medicare Other | Source: Ambulatory Visit | Attending: Physician Assistant | Admitting: Physician Assistant

## 2020-11-20 ENCOUNTER — Other Ambulatory Visit: Payer: Self-pay

## 2020-11-20 ENCOUNTER — Encounter (HOSPITAL_COMMUNITY): Payer: Self-pay | Admitting: Physician Assistant

## 2020-11-20 VITALS — BP 118/66 | HR 78 | Ht 66.0 in | Wt 168.2 lb

## 2020-11-20 DIAGNOSIS — Z7901 Long term (current) use of anticoagulants: Secondary | ICD-10-CM | POA: Insufficient documentation

## 2020-11-20 DIAGNOSIS — E039 Hypothyroidism, unspecified: Secondary | ICD-10-CM | POA: Insufficient documentation

## 2020-11-20 DIAGNOSIS — I484 Atypical atrial flutter: Secondary | ICD-10-CM | POA: Diagnosis not present

## 2020-11-20 DIAGNOSIS — D6869 Other thrombophilia: Secondary | ICD-10-CM

## 2020-11-20 DIAGNOSIS — R5383 Other fatigue: Secondary | ICD-10-CM | POA: Insufficient documentation

## 2020-11-20 DIAGNOSIS — Z79899 Other long term (current) drug therapy: Secondary | ICD-10-CM | POA: Insufficient documentation

## 2020-11-20 MED ORDER — FLECAINIDE ACETATE 50 MG PO TABS: 50.0000 mg | ORAL_TABLET | Freq: Two times a day (BID) | ORAL | 3 refills | Status: AC

## 2020-11-20 NOTE — Telephone Encounter (Signed)
Spoke with pt, aware will re forward the message to dr Jacques Navy to see if she knows of someone in [place] for the patient once she moves there. She would like to be seen in may by the new cardiologist.

## 2020-11-20 NOTE — Progress Notes (Signed)
Primary Care Physician: Willow Ora, MD Primary Cardiologist: Dr Jacques Navy Primary Electrophysiologist: none Referring Physician: Dr [name] is a 87 y.o. female with a history of hypothyroidism, migraines and atrial flutter who presents for follow up in the [facility].  The patient was initially diagnosed with atrial flutter after presenting to the ER on 03/04/19 with symptoms of heart racing in the 140s Bpm. No other associated symptoms, but in hindsight she does note that she was more fatigued. There were no triggers that the patient could identify. She underwent TEE/DCCV after being loaded on IV amiodarone. Patient's amiodarone was stopped 2/2 abnormal thyroid panel. She is s/p repeat DCCV on 08/28/20.  Patient reports that she was back out of rhythm on 11/12/20 with symptoms of heart racing. On follow up today, patient is s/p DCCV on 11/15/20 after starting flecainide. Patient reports she has done well with no further heart racing. She is tolerating the medication without difficulty.   Today, she denies symptoms of palpitations, chest pain, shortness of breath, orthopnea, PND, lower extremity edema, dizziness, presyncope, syncope, snoring, daytime somnolence, bleeding, or neurologic sequela. The patient is tolerating medications without difficulties and is otherwise without complaint today.    Atrial Fibrillation Risk Factors:  she does not have symptoms or diagnosis of sleep apnea. she does not have a history of rheumatic fever. she does not have a history of alcohol use. The patient does not have a history of early familial atrial fibrillation or other arrhythmias.  she has a BMI of Body mass index is 27.15 kg/m.[name] Kitchen Filed Weights   11/20/20 1051  Weight: 76.3 kg    Family History  Problem Relation Age of Onset  . Arthritis Mother   . Cancer Mother   . Cancer Sister   . Diabetes Brother   . Cancer Maternal Grandmother   . Stroke  Maternal Grandfather   . Cancer Paternal Grandfather   . Hearing loss Sister   . Cancer Son      Atrial Fibrillation Management history:  Previous antiarrhythmic drugs: amiodarone, flecainide  Previous cardioversions: 03/08/19, 08/28/20, 11/15/20 Previous ablations: none CHADS2VASC score: 3 Anticoagulation history: Eliquis   Past Medical History:  Diagnosis Date  . Atrial fibrillation (HCC)   . History of migraine - none since age 3 10/27/2017  . Osteoporosis 05/10/2019   dexa 04/2019, lowest T = -2.6 at hip. To discuss 10/20 and offer tx.  . Primary osteoarthritis of knee   . Thyroid disease    Past Surgical History:  Procedure Laterality Date  . BUNIONECTOMY  1991  . CARDIOVERSION N/A 03/08/2019   Procedure: CARDIOVERSION;  Surgeon: Elease Hashimoto Deloris Ping, MD;  Location: [facility] ENDOSCOPY;  Service: Cardiovascular;  Laterality: N/A;  . CARDIOVERSION N/A 08/28/2020   Procedure: CARDIOVERSION;  Surgeon: Meriam Sprague, MD;  Location: [facility] ENDOSCOPY;  Service: Cardiovascular;  Laterality: N/A;  . CARDIOVERSION N/A 11/15/2020   Procedure: CARDIOVERSION;  Surgeon: Jake Bathe, MD;  Location: [facility] ENDOSCOPY;  Service: Cardiovascular;  Laterality: N/A;  . CATARACT EXTRACTION  2008-2009  . TEE WITHOUT CARDIOVERSION N/A 03/08/2019   Procedure: TRANSESOPHAGEAL ECHOCARDIOGRAM (TEE);  Surgeon: Elease Hashimoto Deloris Ping, MD;  Location: [facility] ENDOSCOPY;  Service: Cardiovascular;  Laterality: N/A;  . TONSILLECTOMY AND ADENOIDECTOMY  1953    Current Outpatient Medications  Medication Sig Dispense Refill  . acetaminophen (TYLENOL) 500 MG tablet Take 500 mg by mouth daily as needed for headache or mild pain.    [name] Kitchen apixaban (  ELIQUIS) 5 MG TABS tablet TAKE 1 TABLET(5 MG) BY MOUTH TWICE DAILY 180 tablet 2  . B Complex Vitamins (VITAMIN B COMPLEX PO) Take 1 tablet by mouth daily with breakfast. With Vit C    . Calcium Carbonate-Vitamin D (CALTRATE 600+D PO) Take 1 tablet by mouth daily.    [name] Kitchen diltiazem (CARDIZEM CD)  120 MG 24 hr capsule Take 1 tablet by mouth once a day. Can take extra tablet daily for breakthrough afib. 60 capsule 2  . flecainide (TAMBOCOR) 50 MG tablet Take 1 tablet (50 mg total) by mouth 2 (two) times daily. 60 tablet 3  . levothyroxine (SYNTHROID) 50 MCG tablet TAKE 1 TABLET BY MOUTH IN THE MORNING BEFORE BREAKFAST ON SUNDAY/MONDAY/WEDNESDAY/FRIDAY/SATURDAY AND 2 TABLETS ON TUESDAYS AND THURSDAYS 450 tablet 2   No current facility-administered medications for this encounter.    No Known Allergies  Social History   Socioeconomic History  . Marital status: Divorced    Spouse name: Not on file  . Number of children: Not on file  . Years of education: Not on file  . Highest education level: Not on file  Occupational History  . Occupation: Retired     Comment: Warden/ranger   Tobacco Use  . Smoking status: Never Smoker  . Smokeless tobacco: Never Used  Vaping Use  . Vaping Use: Never used  Substance and Sexual Activity  . Alcohol use: Not Currently    Alcohol/week: 1.0 standard drink    Types: 1 Cans of beer per week    Comment: once a month  . Drug use: No  . Sexual activity: Never    Birth control/protection: Post-menopausal  Other Topics Concern  . Not on file  Social History Narrative   Enjoys spending time with neighbors    Social Determinants of Health   Financial Resource Strain: Not on file  Food Insecurity: Not on file  Transportation Needs: Not on file  Physical Activity: Not on file  Stress: Not on file  Social Connections: Not on file  Intimate Partner Violence: Not on file     ROS- All systems are reviewed and negative except as per the HPI above.  Physical Exam: Vitals:   11/20/20 1051  BP: 118/66  Pulse: 78  Weight: 76.3 kg  Height: 5\' 6"  (1.676 m)    GEN- The patient is a well appearing elderly female, alert and oriented x 3 today.   HEENT-head normocephalic, atraumatic, sclera clear, conjunctiva pink, hearing intact, trachea  midline. Lungs- Clear to ausculation bilaterally, normal work of breathing Heart- Regular rate and rhythm, no murmurs, rubs or gallops  GI- soft, NT, ND, + BS Extremities- no clubbing, cyanosis, or edema MS- no significant deformity or atrophy Skin- no rash or lesion Psych- euthymic mood, full affect Neuro- strength and sensation are intact   Wt Readings from Last 3 Encounters:  11/20/20 76.3 kg  11/15/20 75.3 kg  11/14/20 76.7 kg    EKG today demonstrates  SR, 1st degree AV block Vent. rate 78 BPM PR interval 216 ms QRS duration 88 ms QT/QTcB 390/444 ms  TEE 03/08/19 1. No evidence of a thrombus present in the left atrial appendage.  2. The aortic valve is grossly normal Aortic valve regurgitation is mild by color flow Doppler.  3. The tricuspid valve was grossly normal.  4. The aortic root is normal in size and structure.  5. There is evidence of mild plaque in the descending aorta.  6. The left ventricle has normal systolic function,  with an ejection fraction of 55-60%.  FINDINGS  Left Ventricle: The left ventricle has normal systolic function, with an ejection fraction of 55-60%.  TEE documentation note mentioned EF 35-40%   Echo 06/22/19 1. Left ventricular ejection fraction, by visual estimation, is 60 to  65%. The left ventricle has normal function. There is no left ventricular  hypertrophy.  2. Left ventricular diastolic parameters are indeterminate.  3. Global right ventricle has normal systolic function.The right  ventricular size is normal. No increase in right ventricular wall  thickness.  4. Left atrial size was normal.  5. Right atrial size was normal.  6. The mitral valve is normal in structure. No evidence of mitral valve  regurgitation. No evidence of mitral stenosis.  7. The tricuspid valve is normal in structure. Tricuspid valve  regurgitation is trivial.  8. The aortic valve was not assessed. Aortic valve regurgitation is mild.  No  evidence of aortic valve sclerosis or stenosis.  9. The pulmonic valve was normal in structure. Pulmonic valve  regurgitation is trivial.  10. Normal pulmonary artery systolic pressure.  11. The atrial septum is grossly normal.    Epic records are reviewed at length today  CHA2DS2-VASc Score = 3  The patient's score is based upon: CHF History: No HTN History: No Diabetes History: No Stroke History: No Vascular Disease History: No Age Score: 2 Gender Score: 1      ASSESSMENT AND PLAN: 1. Atypical atrial flutter The patient's CHA2DS2-VASc score is 3, indicating a 3.2% annual risk of stroke.   S/p DCCV on 11/15/20 Patient appears to be maintaining SR. Continue flecainide 50 mg BID Continue diltiazem 120 mg daily Continue Eliquis 5 mg BID with no missed doses for 4 weeks post DCCV.  2. Secondary Hypercoagulable State (ICD10:  D68.69) The patient is at significant risk for stroke/thromboembolism based upon her CHA2DS2-VASc Score of 3.  Continue Apixaban (Eliquis).    Patient moving to [facility] area and plans to establish with cardiology there. AF clinic as needed.    Jorja Loa PA-C Afib [facility] [address] [place], [place] [address] [phone] 11/20/2020 10:57 AM

## 2020-11-20 NOTE — Telephone Encounter (Signed)
Pt is calling back to check to see if a referral was sent to a Cardiologist in [place]. Pt is unaware of the new Cardiologists name or address. Pt states she will be moving to [facility] next month. Please Advise

## 2020-11-21 NOTE — Telephone Encounter (Signed)
I would recommend Dr. Pollyann Kennedy at [facility] [address] [facility]. He is an electrophysiologist (an afib doctor). I do not know any general cardiologists in [facility] but he may be able to recommend someone for you to see. No direct referral is needed - but I'd be happy to send our notes when you set up an appointment. Please come see me anytime, I'd be happy to see you back any time you need.

## 2020-11-22 NOTE — Telephone Encounter (Signed)
Spoke with patient, advised patient of Dr. Lupe Carney recommendations. Patient will call to set up and appointment with Dr. Kathi Ludwig.   Advised patient to call back to office with any issues, questions, or concerns. Advised patient that we can send any records if needed.   Patient very thankful for call back and would like to let Dr. Jacques Navy know how much she appreciates her and would like to thank for Dr. Jacques Navy all she has done for her.   Advised patient I would forward her message to Dr. Jacques Navy.   Advised patient to call back with any issues. Patient verbalized understanding.

## 2020-12-04 DIAGNOSIS — I484 Atypical atrial flutter: Secondary | ICD-10-CM | POA: Diagnosis not present

## 2020-12-04 DIAGNOSIS — I4891 Unspecified atrial fibrillation: Secondary | ICD-10-CM | POA: Diagnosis not present

## 2020-12-06 ENCOUNTER — Other Ambulatory Visit (HOSPITAL_COMMUNITY): Payer: Self-pay | Admitting: *Deleted

## 2020-12-06 MED ORDER — ELIQUIS 5 MG PO TABS: ORAL_TABLET | ORAL | 2 refills | Status: AC

## 2020-12-12 ENCOUNTER — Ambulatory Visit (HOSPITAL_COMMUNITY): Payer: Medicare Other | Admitting: Physician Assistant

## 2020-12-28 DIAGNOSIS — I484 Atypical atrial flutter: Secondary | ICD-10-CM | POA: Diagnosis not present

## 2020-12-28 DIAGNOSIS — I4891 Unspecified atrial fibrillation: Secondary | ICD-10-CM | POA: Diagnosis not present

## 2020-12-28 DIAGNOSIS — I082 Rheumatic disorders of both aortic and tricuspid valves: Secondary | ICD-10-CM | POA: Diagnosis not present

## 2021-01-25 ENCOUNTER — Telehealth: Payer: Self-pay | Admitting: Internal Medicine

## 2021-01-25 NOTE — Telephone Encounter (Signed)
Spoke with patient about scheduling follow up with Dr. Jacques Navy, she told me she now lives in [place] and is seeing the Cardiologist that Dr. Jacques Navy recommended to her.

## 2021-01-26 DIAGNOSIS — E039 Hypothyroidism, unspecified: Secondary | ICD-10-CM | POA: Diagnosis not present

## 2021-01-26 DIAGNOSIS — Z7901 Long term (current) use of anticoagulants: Secondary | ICD-10-CM | POA: Diagnosis not present

## 2021-01-26 DIAGNOSIS — I4891 Unspecified atrial fibrillation: Secondary | ICD-10-CM | POA: Diagnosis not present

## 2021-01-26 DIAGNOSIS — I483 Typical atrial flutter: Secondary | ICD-10-CM | POA: Diagnosis not present

## 2021-01-26 DIAGNOSIS — I484 Atypical atrial flutter: Secondary | ICD-10-CM | POA: Diagnosis not present

## 2021-01-26 DIAGNOSIS — Z79899 Other long term (current) drug therapy: Secondary | ICD-10-CM | POA: Diagnosis not present

## 2021-02-06 ENCOUNTER — Telehealth: Payer: Self-pay | Admitting: Family Medicine

## 2021-02-06 NOTE — Telephone Encounter (Signed)
Copied from CRM [id number]. Topic: Medicare AWV >> Feb 06, 2021 11:50 AM Harris-Coley, Avon Gully wrote: Reason for CRM: Left message for patient to schedule Annual Wellness Visit.  Please schedule with Nurse Health Advisor Lanier Ensign, RN at [facility].

## 2021-03-05 DIAGNOSIS — I483 Typical atrial flutter: Secondary | ICD-10-CM | POA: Diagnosis not present

## 2021-03-05 DIAGNOSIS — I484 Atypical atrial flutter: Secondary | ICD-10-CM | POA: Diagnosis not present

## 2021-03-05 DIAGNOSIS — I4819 Other persistent atrial fibrillation: Secondary | ICD-10-CM | POA: Diagnosis not present

## 2021-03-05 DIAGNOSIS — Z7901 Long term (current) use of anticoagulants: Secondary | ICD-10-CM | POA: Diagnosis not present

## 2021-03-13 DIAGNOSIS — I2584 Coronary atherosclerosis due to calcified coronary lesion: Secondary | ICD-10-CM | POA: Diagnosis not present

## 2021-03-13 DIAGNOSIS — I4819 Other persistent atrial fibrillation: Secondary | ICD-10-CM | POA: Diagnosis not present

## 2021-03-13 DIAGNOSIS — I483 Typical atrial flutter: Secondary | ICD-10-CM | POA: Diagnosis not present

## 2021-04-15 IMAGING — DX DG KNEE AP/LAT W/ SUNRISE*L*
3 series · 3 of 3 positions shown · non-contrast
Comparison: None.

CLINICAL DATA: Bilateral knee pain for 2 weeks, right greater than
left. No reported injury.

EXAM:
LEFT KNEE 3 VIEWS

[knee ap]
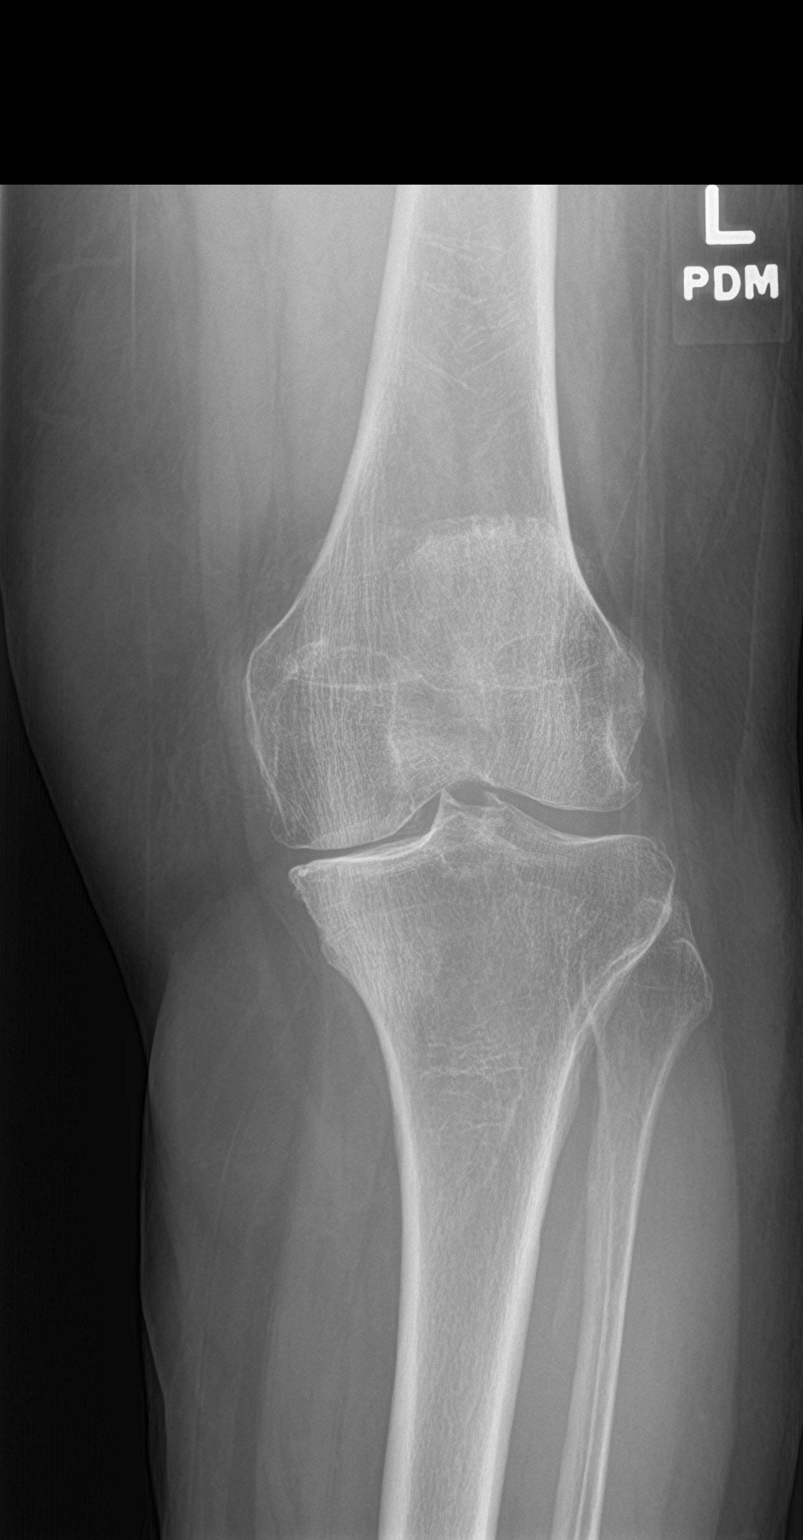

[knee lat]
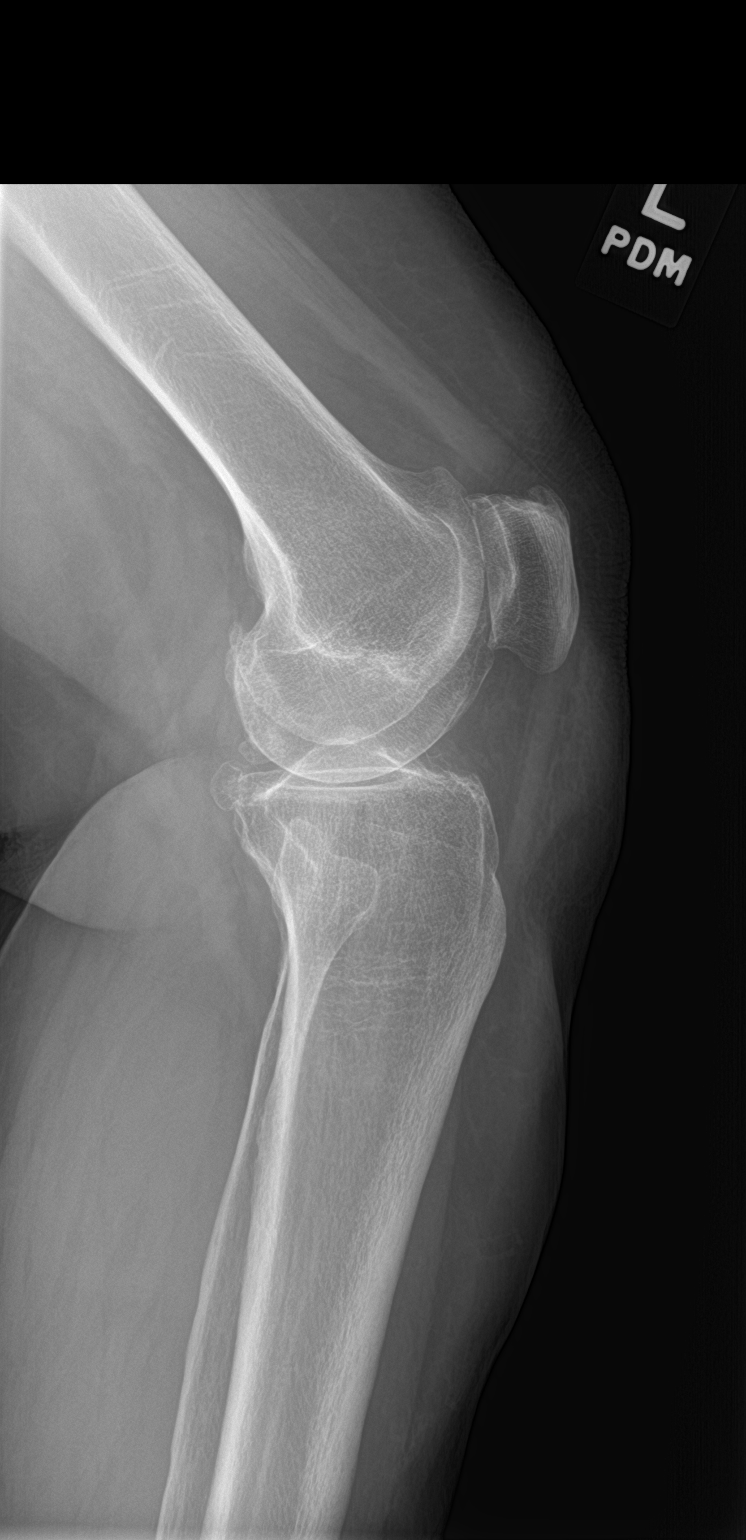

[sunrise]
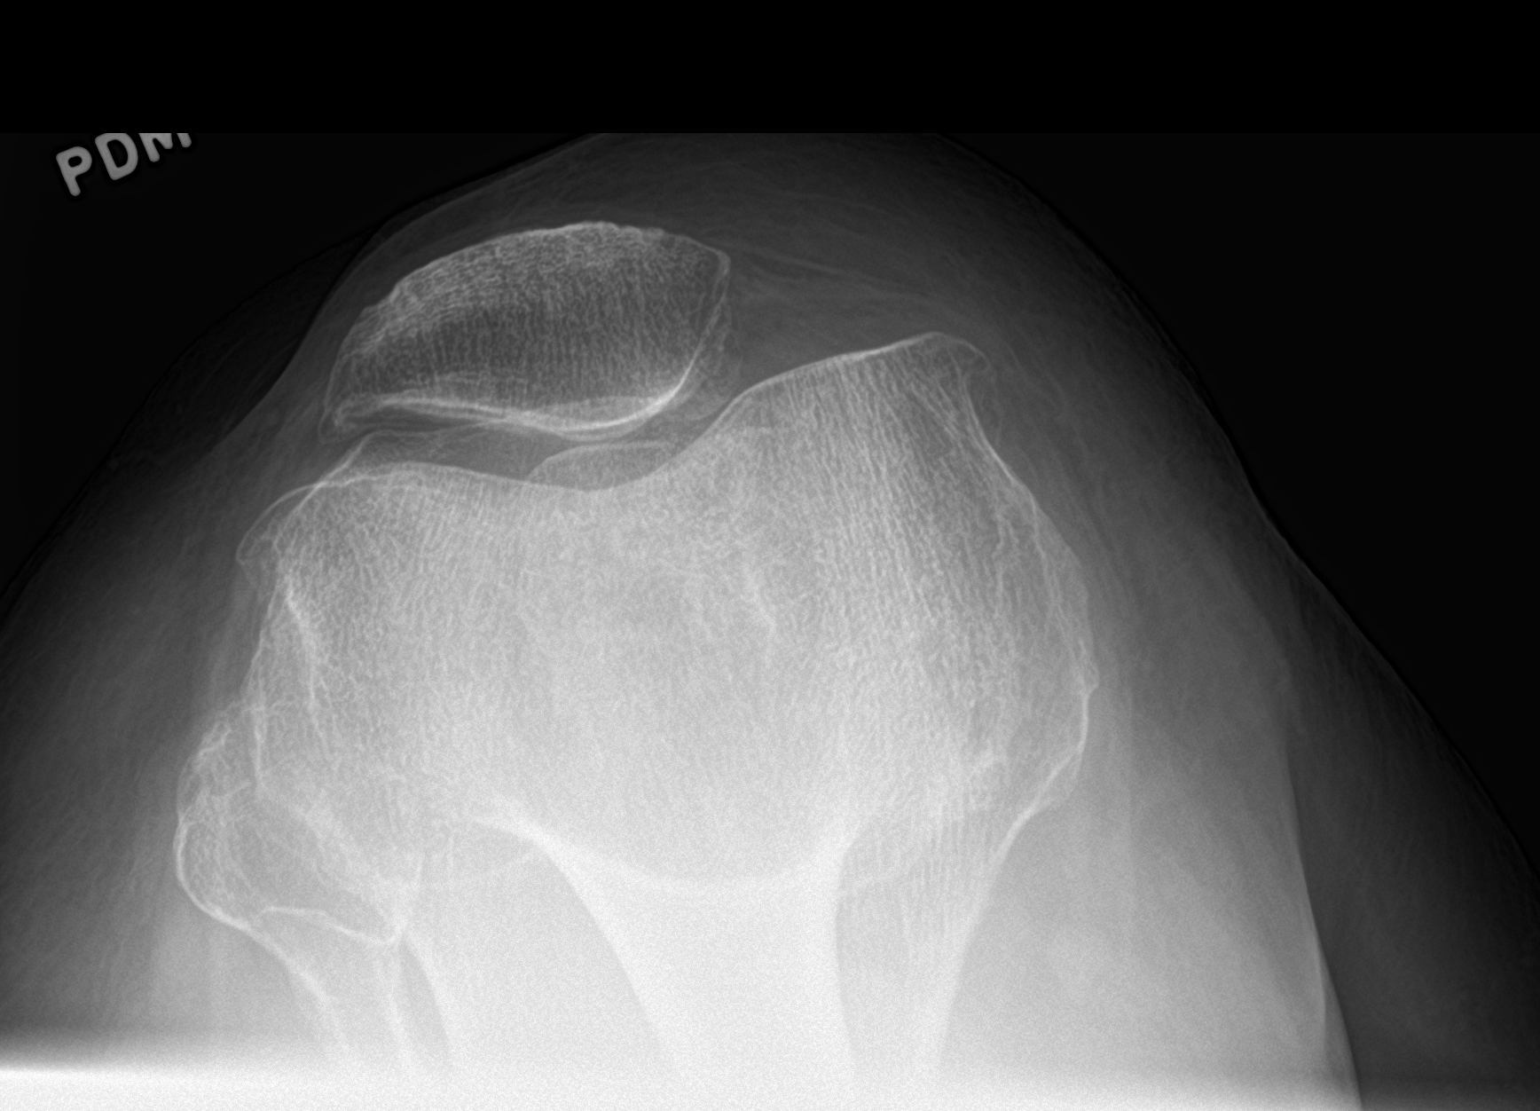

[3 of 3 positions shown; findings below may reference images not displayed]

FINDINGS: No fracture, joint effusion or dislocation. No suspicious focal
osseous lesions. Small superior left patellar enthesophyte. Mild
tricompartmental left knee osteoarthritis, most prominent in the
medial compartment. No radiopaque foreign bodies.
IMPRESSION: Mild tricompartmental left knee osteoarthritis, most prominent in
the medial compartment.

## 2021-05-02 ENCOUNTER — Ambulatory Visit: Payer: Medicare Other | Admitting: Family Medicine

## 2021-05-03 DIAGNOSIS — I484 Atypical atrial flutter: Secondary | ICD-10-CM | POA: Diagnosis not present

## 2021-05-03 DIAGNOSIS — G609 Hereditary and idiopathic neuropathy, unspecified: Secondary | ICD-10-CM | POA: Diagnosis not present

## 2021-05-03 DIAGNOSIS — Z131 Encounter for screening for diabetes mellitus: Secondary | ICD-10-CM | POA: Diagnosis not present

## 2021-05-03 DIAGNOSIS — M810 Age-related osteoporosis without current pathological fracture: Secondary | ICD-10-CM | POA: Diagnosis not present

## 2021-05-03 DIAGNOSIS — Z780 Asymptomatic menopausal state: Secondary | ICD-10-CM | POA: Diagnosis not present

## 2021-05-03 DIAGNOSIS — R799 Abnormal finding of blood chemistry, unspecified: Secondary | ICD-10-CM | POA: Diagnosis not present

## 2021-05-03 DIAGNOSIS — E039 Hypothyroidism, unspecified: Secondary | ICD-10-CM | POA: Diagnosis not present

## 2021-05-17 DIAGNOSIS — Z780 Asymptomatic menopausal state: Secondary | ICD-10-CM | POA: Diagnosis not present

## 2021-05-17 DIAGNOSIS — M8440XA Pathological fracture, unspecified site, initial encounter for fracture: Secondary | ICD-10-CM | POA: Diagnosis not present

## 2021-05-17 DIAGNOSIS — M810 Age-related osteoporosis without current pathological fracture: Secondary | ICD-10-CM | POA: Diagnosis not present

## 2021-07-19 DIAGNOSIS — D225 Melanocytic nevi of trunk: Secondary | ICD-10-CM | POA: Diagnosis not present

## 2021-07-19 DIAGNOSIS — L218 Other seborrheic dermatitis: Secondary | ICD-10-CM | POA: Diagnosis not present

## 2021-07-19 DIAGNOSIS — L821 Other seborrheic keratosis: Secondary | ICD-10-CM | POA: Diagnosis not present

## 2021-07-19 DIAGNOSIS — L2084 Intrinsic (allergic) eczema: Secondary | ICD-10-CM | POA: Diagnosis not present

## 2021-07-23 DIAGNOSIS — I484 Atypical atrial flutter: Secondary | ICD-10-CM | POA: Diagnosis not present

## 2021-07-30 DIAGNOSIS — N189 Chronic kidney disease, unspecified: Secondary | ICD-10-CM | POA: Diagnosis not present

## 2021-07-30 DIAGNOSIS — I4819 Other persistent atrial fibrillation: Secondary | ICD-10-CM | POA: Diagnosis not present

## 2021-07-30 DIAGNOSIS — I484 Atypical atrial flutter: Secondary | ICD-10-CM | POA: Diagnosis not present

## 2021-07-30 DIAGNOSIS — I351 Nonrheumatic aortic (valve) insufficiency: Secondary | ICD-10-CM | POA: Diagnosis not present

## 2021-07-30 DIAGNOSIS — I447 Left bundle-branch block, unspecified: Secondary | ICD-10-CM | POA: Diagnosis not present

## 2021-07-30 DIAGNOSIS — E079 Disorder of thyroid, unspecified: Secondary | ICD-10-CM | POA: Diagnosis not present

## 2021-07-30 DIAGNOSIS — I483 Typical atrial flutter: Secondary | ICD-10-CM | POA: Diagnosis not present

## 2021-07-30 DIAGNOSIS — I4892 Unspecified atrial flutter: Secondary | ICD-10-CM | POA: Diagnosis not present

## 2021-08-02 DIAGNOSIS — I484 Atypical atrial flutter: Secondary | ICD-10-CM | POA: Diagnosis not present

## 2021-08-03 DIAGNOSIS — Z79890 Hormone replacement therapy (postmenopausal): Secondary | ICD-10-CM | POA: Diagnosis not present

## 2021-08-03 DIAGNOSIS — N189 Chronic kidney disease, unspecified: Secondary | ICD-10-CM | POA: Diagnosis not present

## 2021-08-03 DIAGNOSIS — Z7901 Long term (current) use of anticoagulants: Secondary | ICD-10-CM | POA: Diagnosis not present

## 2021-08-03 DIAGNOSIS — I4891 Unspecified atrial fibrillation: Secondary | ICD-10-CM | POA: Diagnosis not present

## 2021-08-03 DIAGNOSIS — I129 Hypertensive chronic kidney disease with stage 1 through stage 4 chronic kidney disease, or unspecified chronic kidney disease: Secondary | ICD-10-CM | POA: Diagnosis not present

## 2021-08-03 DIAGNOSIS — Z7983 Long term (current) use of bisphosphonates: Secondary | ICD-10-CM | POA: Diagnosis not present

## 2021-08-03 DIAGNOSIS — Z79899 Other long term (current) drug therapy: Secondary | ICD-10-CM | POA: Diagnosis not present

## 2021-08-05 DIAGNOSIS — I4891 Unspecified atrial fibrillation: Secondary | ICD-10-CM | POA: Diagnosis not present

## 2021-09-17 DIAGNOSIS — I484 Atypical atrial flutter: Secondary | ICD-10-CM | POA: Diagnosis not present

## 2021-09-17 DIAGNOSIS — R0609 Other forms of dyspnea: Secondary | ICD-10-CM | POA: Diagnosis not present

## 2021-09-17 DIAGNOSIS — E039 Hypothyroidism, unspecified: Secondary | ICD-10-CM | POA: Diagnosis not present

## 2021-09-17 DIAGNOSIS — I4811 Longstanding persistent atrial fibrillation: Secondary | ICD-10-CM | POA: Diagnosis not present

## 2021-09-20 DIAGNOSIS — I484 Atypical atrial flutter: Secondary | ICD-10-CM | POA: Diagnosis not present

## 2021-09-20 DIAGNOSIS — E039 Hypothyroidism, unspecified: Secondary | ICD-10-CM | POA: Diagnosis not present

## 2021-09-20 DIAGNOSIS — R0600 Dyspnea, unspecified: Secondary | ICD-10-CM | POA: Diagnosis not present

## 2021-09-21 DIAGNOSIS — I484 Atypical atrial flutter: Secondary | ICD-10-CM | POA: Diagnosis not present

## 2021-10-01 DIAGNOSIS — I4819 Other persistent atrial fibrillation: Secondary | ICD-10-CM | POA: Diagnosis not present

## 2021-10-01 DIAGNOSIS — I2510 Atherosclerotic heart disease of native coronary artery without angina pectoris: Secondary | ICD-10-CM | POA: Diagnosis not present

## 2021-10-02 DIAGNOSIS — I083 Combined rheumatic disorders of mitral, aortic and tricuspid valves: Secondary | ICD-10-CM | POA: Diagnosis not present

## 2021-10-02 DIAGNOSIS — I517 Cardiomegaly: Secondary | ICD-10-CM | POA: Diagnosis not present

## 2021-10-02 DIAGNOSIS — I4891 Unspecified atrial fibrillation: Secondary | ICD-10-CM | POA: Diagnosis not present

## 2021-10-02 DIAGNOSIS — I5020 Unspecified systolic (congestive) heart failure: Secondary | ICD-10-CM | POA: Diagnosis not present

## 2021-10-02 DIAGNOSIS — I21A1 Myocardial infarction type 2: Secondary | ICD-10-CM | POA: Diagnosis not present

## 2021-10-02 DIAGNOSIS — I4819 Other persistent atrial fibrillation: Secondary | ICD-10-CM | POA: Diagnosis not present

## 2021-10-02 DIAGNOSIS — I429 Cardiomyopathy, unspecified: Secondary | ICD-10-CM | POA: Diagnosis not present

## 2021-10-02 DIAGNOSIS — I484 Atypical atrial flutter: Secondary | ICD-10-CM | POA: Diagnosis not present

## 2021-10-02 DIAGNOSIS — R042 Hemoptysis: Secondary | ICD-10-CM | POA: Diagnosis not present

## 2021-10-02 DIAGNOSIS — J90 Pleural effusion, not elsewhere classified: Secondary | ICD-10-CM | POA: Diagnosis not present

## 2021-10-02 DIAGNOSIS — I4821 Permanent atrial fibrillation: Secondary | ICD-10-CM | POA: Diagnosis not present

## 2021-10-02 DIAGNOSIS — Z7901 Long term (current) use of anticoagulants: Secondary | ICD-10-CM | POA: Diagnosis not present

## 2021-10-02 DIAGNOSIS — N1831 Chronic kidney disease, stage 3a: Secondary | ICD-10-CM | POA: Diagnosis not present

## 2021-10-02 DIAGNOSIS — J984 Other disorders of lung: Secondary | ICD-10-CM | POA: Diagnosis not present

## 2021-10-02 DIAGNOSIS — R918 Other nonspecific abnormal finding of lung field: Secondary | ICD-10-CM | POA: Diagnosis not present

## 2021-10-02 DIAGNOSIS — I351 Nonrheumatic aortic (valve) insufficiency: Secondary | ICD-10-CM | POA: Diagnosis not present

## 2021-10-02 DIAGNOSIS — I440 Atrioventricular block, first degree: Secondary | ICD-10-CM | POA: Diagnosis not present

## 2021-10-02 DIAGNOSIS — I509 Heart failure, unspecified: Secondary | ICD-10-CM | POA: Diagnosis not present

## 2021-10-02 DIAGNOSIS — I5023 Acute on chronic systolic (congestive) heart failure: Secondary | ICD-10-CM | POA: Diagnosis not present

## 2021-10-02 DIAGNOSIS — Z4682 Encounter for fitting and adjustment of non-vascular catheter: Secondary | ICD-10-CM | POA: Diagnosis not present

## 2021-10-02 DIAGNOSIS — M810 Age-related osteoporosis without current pathological fracture: Secondary | ICD-10-CM | POA: Diagnosis not present

## 2021-10-02 DIAGNOSIS — Z79890 Hormone replacement therapy (postmenopausal): Secondary | ICD-10-CM | POA: Diagnosis not present

## 2021-10-02 DIAGNOSIS — E039 Hypothyroidism, unspecified: Secondary | ICD-10-CM | POA: Diagnosis not present

## 2021-10-07 DIAGNOSIS — I4892 Unspecified atrial flutter: Secondary | ICD-10-CM | POA: Diagnosis not present

## 2021-10-07 DIAGNOSIS — I21A1 Myocardial infarction type 2: Secondary | ICD-10-CM | POA: Diagnosis not present

## 2021-10-07 DIAGNOSIS — I130 Hypertensive heart and chronic kidney disease with heart failure and stage 1 through stage 4 chronic kidney disease, or unspecified chronic kidney disease: Secondary | ICD-10-CM | POA: Diagnosis not present

## 2021-10-07 DIAGNOSIS — R002 Palpitations: Secondary | ICD-10-CM | POA: Diagnosis not present

## 2021-10-07 DIAGNOSIS — E039 Hypothyroidism, unspecified: Secondary | ICD-10-CM | POA: Diagnosis not present

## 2021-10-07 DIAGNOSIS — Z7901 Long term (current) use of anticoagulants: Secondary | ICD-10-CM | POA: Diagnosis not present

## 2021-10-07 DIAGNOSIS — M810 Age-related osteoporosis without current pathological fracture: Secondary | ICD-10-CM | POA: Diagnosis not present

## 2021-10-07 DIAGNOSIS — K5900 Constipation, unspecified: Secondary | ICD-10-CM | POA: Diagnosis not present

## 2021-10-07 DIAGNOSIS — I5022 Chronic systolic (congestive) heart failure: Secondary | ICD-10-CM | POA: Diagnosis not present

## 2021-10-07 DIAGNOSIS — I484 Atypical atrial flutter: Secondary | ICD-10-CM | POA: Diagnosis not present

## 2021-10-07 DIAGNOSIS — R000 Tachycardia, unspecified: Secondary | ICD-10-CM | POA: Diagnosis not present

## 2021-10-07 DIAGNOSIS — I4821 Permanent atrial fibrillation: Secondary | ICD-10-CM | POA: Diagnosis not present

## 2021-10-07 DIAGNOSIS — Z20822 Contact with and (suspected) exposure to covid-19: Secondary | ICD-10-CM | POA: Diagnosis not present

## 2021-10-07 DIAGNOSIS — N1831 Chronic kidney disease, stage 3a: Secondary | ICD-10-CM | POA: Diagnosis not present

## 2021-10-11 DIAGNOSIS — I5020 Unspecified systolic (congestive) heart failure: Secondary | ICD-10-CM | POA: Diagnosis not present

## 2021-10-15 DIAGNOSIS — I5020 Unspecified systolic (congestive) heart failure: Secondary | ICD-10-CM | POA: Diagnosis not present

## 2021-10-15 DIAGNOSIS — I484 Atypical atrial flutter: Secondary | ICD-10-CM | POA: Diagnosis not present

## 2021-10-15 DIAGNOSIS — I4819 Other persistent atrial fibrillation: Secondary | ICD-10-CM | POA: Diagnosis not present

## 2021-10-19 DIAGNOSIS — I4819 Other persistent atrial fibrillation: Secondary | ICD-10-CM | POA: Diagnosis not present

## 2021-10-21 DIAGNOSIS — I4819 Other persistent atrial fibrillation: Secondary | ICD-10-CM | POA: Diagnosis not present

## 2021-10-22 DIAGNOSIS — I5022 Chronic systolic (congestive) heart failure: Secondary | ICD-10-CM | POA: Diagnosis not present

## 2021-10-22 DIAGNOSIS — I4892 Unspecified atrial flutter: Secondary | ICD-10-CM | POA: Diagnosis not present

## 2021-10-22 DIAGNOSIS — I5020 Unspecified systolic (congestive) heart failure: Secondary | ICD-10-CM | POA: Diagnosis not present

## 2021-10-22 DIAGNOSIS — E039 Hypothyroidism, unspecified: Secondary | ICD-10-CM | POA: Diagnosis not present

## 2021-10-22 DIAGNOSIS — Z7901 Long term (current) use of anticoagulants: Secondary | ICD-10-CM | POA: Diagnosis not present

## 2021-10-22 DIAGNOSIS — I440 Atrioventricular block, first degree: Secondary | ICD-10-CM | POA: Diagnosis not present

## 2021-10-22 DIAGNOSIS — I4891 Unspecified atrial fibrillation: Secondary | ICD-10-CM | POA: Diagnosis not present

## 2021-10-23 DIAGNOSIS — I4819 Other persistent atrial fibrillation: Secondary | ICD-10-CM | POA: Diagnosis not present

## 2021-11-09 DIAGNOSIS — R030 Elevated blood-pressure reading, without diagnosis of hypertension: Secondary | ICD-10-CM | POA: Diagnosis not present

## 2021-11-09 DIAGNOSIS — I484 Atypical atrial flutter: Secondary | ICD-10-CM | POA: Diagnosis not present

## 2021-11-09 DIAGNOSIS — E039 Hypothyroidism, unspecified: Secondary | ICD-10-CM | POA: Diagnosis not present

## 2021-11-09 DIAGNOSIS — I5020 Unspecified systolic (congestive) heart failure: Secondary | ICD-10-CM | POA: Diagnosis not present

## 2021-11-09 DIAGNOSIS — N1831 Chronic kidney disease, stage 3a: Secondary | ICD-10-CM | POA: Diagnosis not present

## 2021-11-12 DIAGNOSIS — I5020 Unspecified systolic (congestive) heart failure: Secondary | ICD-10-CM | POA: Diagnosis not present

## 2021-11-12 DIAGNOSIS — I4819 Other persistent atrial fibrillation: Secondary | ICD-10-CM | POA: Diagnosis not present

## 2021-11-20 ENCOUNTER — Telehealth: Payer: Self-pay

## 2021-11-20 ENCOUNTER — Other Ambulatory Visit (HOSPITAL_COMMUNITY): Payer: Self-pay | Admitting: Physician Assistant

## 2021-11-20 NOTE — Telephone Encounter (Signed)
Received refill request for patients Flecainide.  ? ?Per chart review patient is seeing Cardiology at [facility] Dr. Dossie Der and is no longer on Flecainide.  ? ?Called and spoke with patient, confirmed she is no longer taking this medication. Patient states she is receiving all of her medications through [facility] cardiology. Patient expressed thanks for reaching out and checking.  ? ?Advised patient to call back to office with any issues, questions, or concerns. Patient verbalized understanding.  ? ?

## 2021-12-24 DIAGNOSIS — I5020 Unspecified systolic (congestive) heart failure: Secondary | ICD-10-CM | POA: Diagnosis not present

## 2021-12-24 DIAGNOSIS — I480 Paroxysmal atrial fibrillation: Secondary | ICD-10-CM | POA: Diagnosis not present

## 2021-12-24 DIAGNOSIS — E039 Hypothyroidism, unspecified: Secondary | ICD-10-CM | POA: Diagnosis not present

## 2021-12-24 DIAGNOSIS — Z79899 Other long term (current) drug therapy: Secondary | ICD-10-CM | POA: Diagnosis not present

## 2021-12-24 DIAGNOSIS — N1831 Chronic kidney disease, stage 3a: Secondary | ICD-10-CM | POA: Diagnosis not present

## 2021-12-31 DIAGNOSIS — I480 Paroxysmal atrial fibrillation: Secondary | ICD-10-CM | POA: Diagnosis not present

## 2022-01-23 DIAGNOSIS — I440 Atrioventricular block, first degree: Secondary | ICD-10-CM | POA: Diagnosis not present

## 2022-01-23 DIAGNOSIS — I952 Hypotension due to drugs: Secondary | ICD-10-CM | POA: Diagnosis not present

## 2022-01-23 DIAGNOSIS — R001 Bradycardia, unspecified: Secondary | ICD-10-CM | POA: Diagnosis not present

## 2022-01-23 DIAGNOSIS — I484 Atypical atrial flutter: Secondary | ICD-10-CM | POA: Diagnosis not present

## 2022-01-23 DIAGNOSIS — I4819 Other persistent atrial fibrillation: Secondary | ICD-10-CM | POA: Diagnosis not present

## 2022-01-23 DIAGNOSIS — I5022 Chronic systolic (congestive) heart failure: Secondary | ICD-10-CM | POA: Diagnosis not present

## 2022-01-23 DIAGNOSIS — I4891 Unspecified atrial fibrillation: Secondary | ICD-10-CM | POA: Diagnosis not present

## 2022-01-23 DIAGNOSIS — I5020 Unspecified systolic (congestive) heart failure: Secondary | ICD-10-CM | POA: Diagnosis not present

## 2022-01-23 DIAGNOSIS — Z7901 Long term (current) use of anticoagulants: Secondary | ICD-10-CM | POA: Diagnosis not present

## 2022-01-23 DIAGNOSIS — E039 Hypothyroidism, unspecified: Secondary | ICD-10-CM | POA: Diagnosis not present

## 2022-01-23 DIAGNOSIS — I351 Nonrheumatic aortic (valve) insufficiency: Secondary | ICD-10-CM | POA: Diagnosis not present

## 2022-01-23 DIAGNOSIS — I4892 Unspecified atrial flutter: Secondary | ICD-10-CM | POA: Diagnosis not present

## 2022-01-23 DIAGNOSIS — Z79890 Hormone replacement therapy (postmenopausal): Secondary | ICD-10-CM | POA: Diagnosis not present

## 2022-01-23 DIAGNOSIS — I2510 Atherosclerotic heart disease of native coronary artery without angina pectoris: Secondary | ICD-10-CM | POA: Diagnosis not present

## 2022-01-24 DIAGNOSIS — I4819 Other persistent atrial fibrillation: Secondary | ICD-10-CM | POA: Diagnosis not present

## 2022-02-12 DIAGNOSIS — I480 Paroxysmal atrial fibrillation: Secondary | ICD-10-CM | POA: Diagnosis not present

## 2022-02-12 DIAGNOSIS — N1831 Chronic kidney disease, stage 3a: Secondary | ICD-10-CM | POA: Diagnosis not present

## 2022-02-12 DIAGNOSIS — E039 Hypothyroidism, unspecified: Secondary | ICD-10-CM | POA: Diagnosis not present

## 2022-02-12 DIAGNOSIS — R42 Dizziness and giddiness: Secondary | ICD-10-CM | POA: Diagnosis not present

## 2022-03-20 DIAGNOSIS — N200 Calculus of kidney: Secondary | ICD-10-CM | POA: Diagnosis not present

## 2022-03-20 DIAGNOSIS — N1831 Chronic kidney disease, stage 3a: Secondary | ICD-10-CM | POA: Diagnosis not present

## 2022-04-01 DIAGNOSIS — I5020 Unspecified systolic (congestive) heart failure: Secondary | ICD-10-CM | POA: Diagnosis not present

## 2022-05-01 ENCOUNTER — Telehealth: Payer: Self-pay | Admitting: *Deleted

## 2022-05-01 NOTE — Patient Instructions (Signed)
Visit Information  Thank you for taking time to visit with me today. Please don't hesitate to contact me if I can be of assistance to you.   Following are the goals we discussed today:   Goals Addressed   None     Please call the care guide team at [phone] if you need to cancel or reschedule your appointment.   If you are experiencing a Mental Health or Behavioral Health Crisis or need someone to talk to, please call the Suicide and Crisis Lifeline: 988  Patient verbalizes understanding of instructions and care plan provided today and agrees to view in MyChart. Active MyChart status and patient understanding of how to access instructions and care plan via MyChart confirmed with patient.     No further follow up required: No needs as pt with providers located at her new address in [place], [place]  Elliot Cousin, RN Care Management Coordinator [facility] [phone]

## 2022-05-01 NOTE — Patient Outreach (Signed)
  Care Coordination   Initial Visit Note   05/01/2022 Name: Cathy Lambert MRN: [id number] DOB: [birth year]  Cathy Lambert is a 88 y.o. year old female who sees No primary care provider on file. for primary care. I spoke with  Cathy Lambert by phone today.  What matters to the patients health and wellness today?  na    Goals Addressed   None     SDOH assessments and interventions completed:  No     Care Coordination Interventions Activated:  No  Care Coordination Interventions:  No, not indicated   Follow up plan: No further intervention required. Pt has moved to [facility] and has new providers.  Encounter Outcome:  Pt. Visit Completed   Elliot Cousin, RN Care Management Coordinator [facility] [employer] Main Office [phone]

## 2022-08-05 DIAGNOSIS — I5020 Unspecified systolic (congestive) heart failure: Secondary | ICD-10-CM | POA: Diagnosis not present

## 2022-08-05 DIAGNOSIS — I480 Paroxysmal atrial fibrillation: Secondary | ICD-10-CM | POA: Diagnosis not present

## 2022-08-20 DIAGNOSIS — M810 Age-related osteoporosis without current pathological fracture: Secondary | ICD-10-CM | POA: Diagnosis not present

## 2022-08-20 DIAGNOSIS — E039 Hypothyroidism, unspecified: Secondary | ICD-10-CM | POA: Diagnosis not present

## 2022-08-20 DIAGNOSIS — R030 Elevated blood-pressure reading, without diagnosis of hypertension: Secondary | ICD-10-CM | POA: Diagnosis not present

## 2022-08-20 DIAGNOSIS — R0981 Nasal congestion: Secondary | ICD-10-CM | POA: Diagnosis not present

## 2022-08-20 DIAGNOSIS — U071 COVID-19: Secondary | ICD-10-CM | POA: Diagnosis not present

## 2022-09-26 ENCOUNTER — Encounter (HOSPITAL_COMMUNITY): Payer: Self-pay | Admitting: *Deleted

## 2022-11-25 DIAGNOSIS — I5020 Unspecified systolic (congestive) heart failure: Secondary | ICD-10-CM | POA: Diagnosis not present

## 2023-05-05 DIAGNOSIS — N1831 Chronic kidney disease, stage 3a: Secondary | ICD-10-CM | POA: Diagnosis not present

## 2023-05-05 DIAGNOSIS — M6281 Muscle weakness (generalized): Secondary | ICD-10-CM | POA: Diagnosis not present

## 2023-05-05 DIAGNOSIS — M810 Age-related osteoporosis without current pathological fracture: Secondary | ICD-10-CM | POA: Diagnosis not present

## 2023-05-05 DIAGNOSIS — E039 Hypothyroidism, unspecified: Secondary | ICD-10-CM | POA: Diagnosis not present

## 2023-05-12 DIAGNOSIS — I5020 Unspecified systolic (congestive) heart failure: Secondary | ICD-10-CM | POA: Diagnosis not present

## 2023-05-15 DIAGNOSIS — I5020 Unspecified systolic (congestive) heart failure: Secondary | ICD-10-CM | POA: Diagnosis not present

## 2023-07-31 DIAGNOSIS — E039 Hypothyroidism, unspecified: Secondary | ICD-10-CM | POA: Diagnosis not present

## 2023-07-31 DIAGNOSIS — L989 Disorder of the skin and subcutaneous tissue, unspecified: Secondary | ICD-10-CM | POA: Diagnosis not present

## 2023-08-05 DIAGNOSIS — D492 Neoplasm of unspecified behavior of bone, soft tissue, and skin: Secondary | ICD-10-CM | POA: Diagnosis not present

## 2023-08-05 DIAGNOSIS — C44629 Squamous cell carcinoma of skin of left upper limb, including shoulder: Secondary | ICD-10-CM | POA: Diagnosis not present
